# Patient Record
Sex: Male | Born: 1950 | Race: White | Hispanic: No | Marital: Married | Smoking: Former smoker
Health system: Southern US, Community
[De-identification: ages and names within clinical notes are randomized; demographics above are authoritative.]

## PROBLEM LIST (undated history)

## (undated) DIAGNOSIS — E119 Type 2 diabetes mellitus without complications: Secondary | ICD-10-CM

## (undated) DIAGNOSIS — H8109 Meniere's disease, unspecified ear: Secondary | ICD-10-CM

## (undated) HISTORY — PX: HERNIA REPAIR: SHX51

## (undated) HISTORY — PX: BACK SURGERY: SHX140

## (undated) HISTORY — PX: KNEE SURGERY: SHX244

---

## 2004-03-20 ENCOUNTER — Ambulatory Visit: Payer: Self-pay | Admitting: Family Medicine

## 2004-07-16 ENCOUNTER — Ambulatory Visit: Payer: Self-pay | Admitting: Family Medicine

## 2004-07-23 ENCOUNTER — Ambulatory Visit: Payer: Self-pay | Admitting: Family Medicine

## 2007-08-02 ENCOUNTER — Ambulatory Visit: Payer: Self-pay | Admitting: Urology

## 2009-08-19 ENCOUNTER — Ambulatory Visit: Payer: Self-pay | Admitting: Internal Medicine

## 2013-09-08 ENCOUNTER — Ambulatory Visit: Payer: Self-pay

## 2013-09-08 LAB — COMPREHENSIVE METABOLIC PANEL
ANION GAP: 11 (ref 7–16)
Albumin: 3.6 g/dL (ref 3.4–5.0)
Alkaline Phosphatase: 97 U/L
BUN: 11 mg/dL (ref 7–18)
Bilirubin,Total: 0.6 mg/dL (ref 0.2–1.0)
CO2: 29 mmol/L (ref 21–32)
CREATININE: 1.1 mg/dL (ref 0.60–1.30)
Calcium, Total: 8.9 mg/dL (ref 8.5–10.1)
Chloride: 93 mmol/L — ABNORMAL LOW (ref 98–107)
EGFR (African American): >60
EGFR (Non-African Amer.): >60
Glucose: 245 mg/dL — ABNORMAL HIGH (ref 65–99)
OSMOLALITY: 274 (ref 275–301)
Potassium: 3.2 mmol/L — ABNORMAL LOW (ref 3.5–5.1)
SGOT(AST): 30 U/L (ref 15–37)
SGPT (ALT): 38 U/L
Sodium: 133 mmol/L — ABNORMAL LOW (ref 136–145)
Total Protein: 7.9 g/dL (ref 6.4–8.2)

## 2013-09-08 LAB — CBC WITH DIFFERENTIAL/PLATELET
Basophil #: 0 10*3/uL (ref 0.0–0.1)
Basophil %: 0.2 %
EOS ABS: 0 10*3/uL (ref 0.0–0.7)
Eosinophil %: 0.1 %
HCT: 44.7 % (ref 40.0–52.0)
HGB: 15.2 g/dL (ref 13.0–18.0)
Lymphocyte #: 1.2 10*3/uL (ref 1.0–3.6)
Lymphocyte %: 11.6 %
MCH: 31.1 pg (ref 26.0–34.0)
MCHC: 34.1 g/dL (ref 32.0–36.0)
MCV: 91 fL (ref 80–100)
MONOS PCT: 11.6 %
Monocyte #: 1.2 x10 3/mm — ABNORMAL HIGH (ref 0.2–1.0)
Neutrophil #: 8.1 10*3/uL — ABNORMAL HIGH (ref 1.4–6.5)
Neutrophil %: 76.5 %
PLATELETS: 160 10*3/uL (ref 150–440)
RBC: 4.9 10*6/uL (ref 4.40–5.90)
RDW: 13.2 % (ref 11.5–14.5)
WBC: 10.6 10*3/uL (ref 3.8–10.6)

## 2013-09-08 LAB — AMYLASE: AMYLASE: 18 U/L — AB (ref 25–115)

## 2013-09-08 LAB — LIPASE, BLOOD: Lipase: 137 U/L (ref 73–393)

## 2016-05-07 IMAGING — CR DG ABDOMEN 3V
5 series · 5 of 5 positions shown · non-contrast
Comparison: None

CLINICAL DATA: Abdominal pain, vomiting, diarrhea

EXAM:
ABDOMEN SERIES

[chest pa]
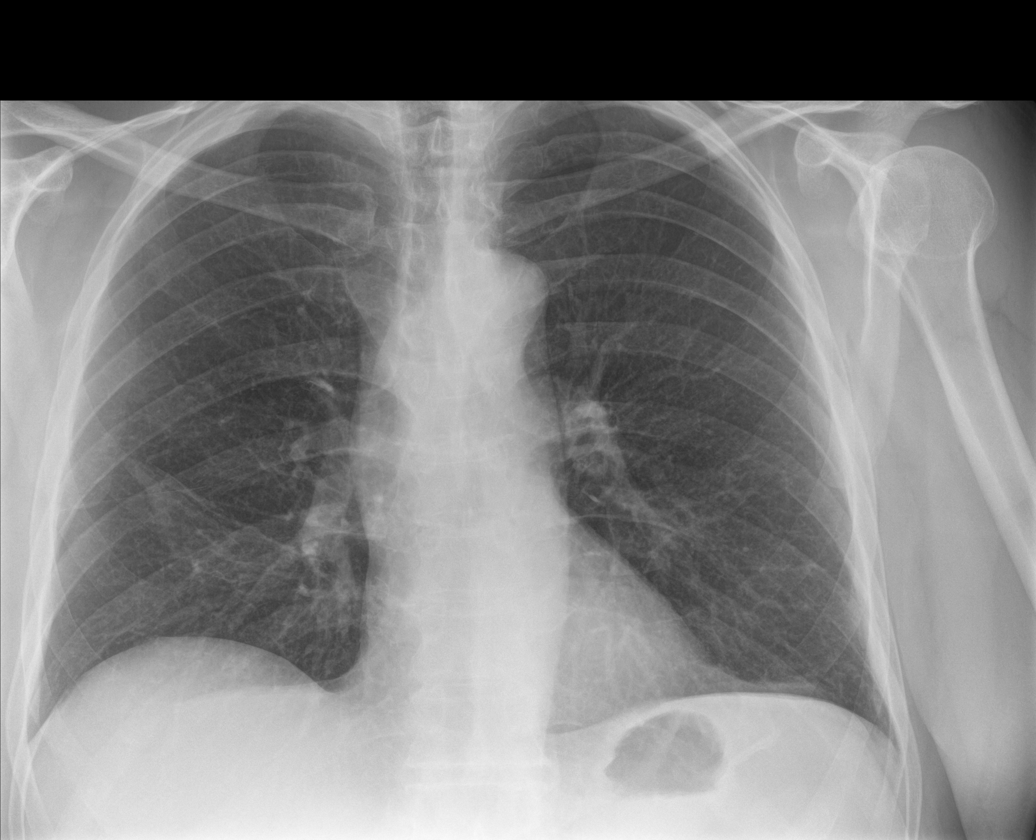

[abdomen erect (1 of 2)]
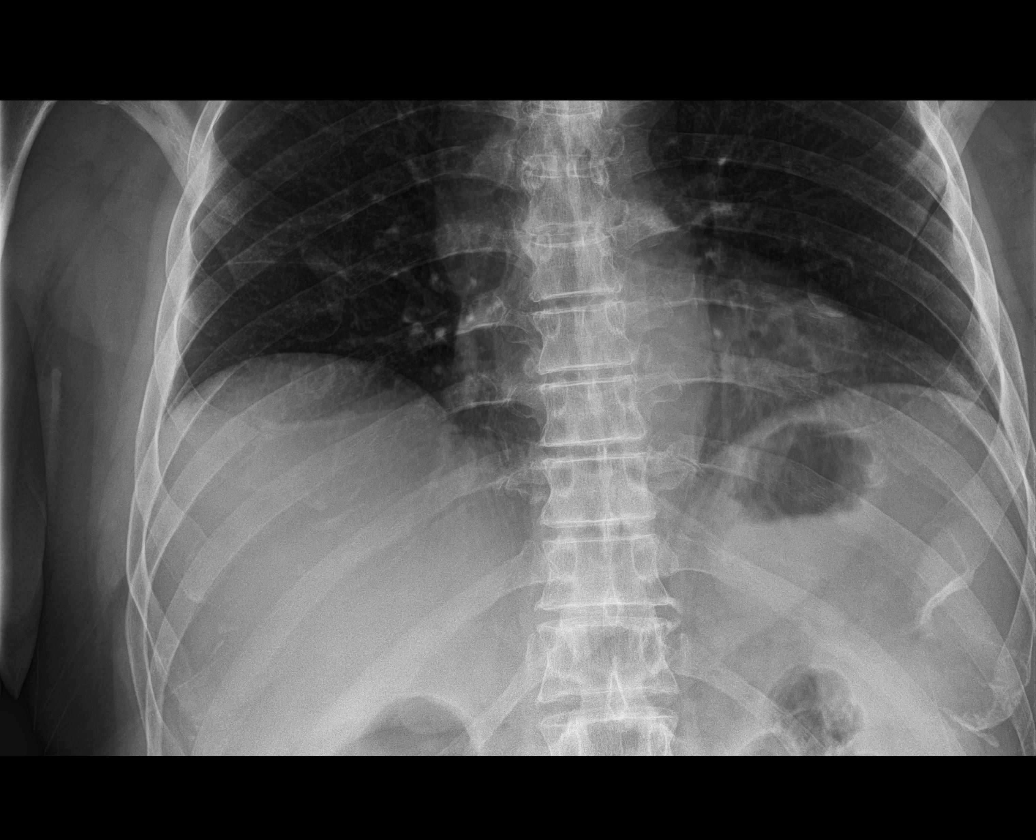

[abdomen erect (2 of 2)]
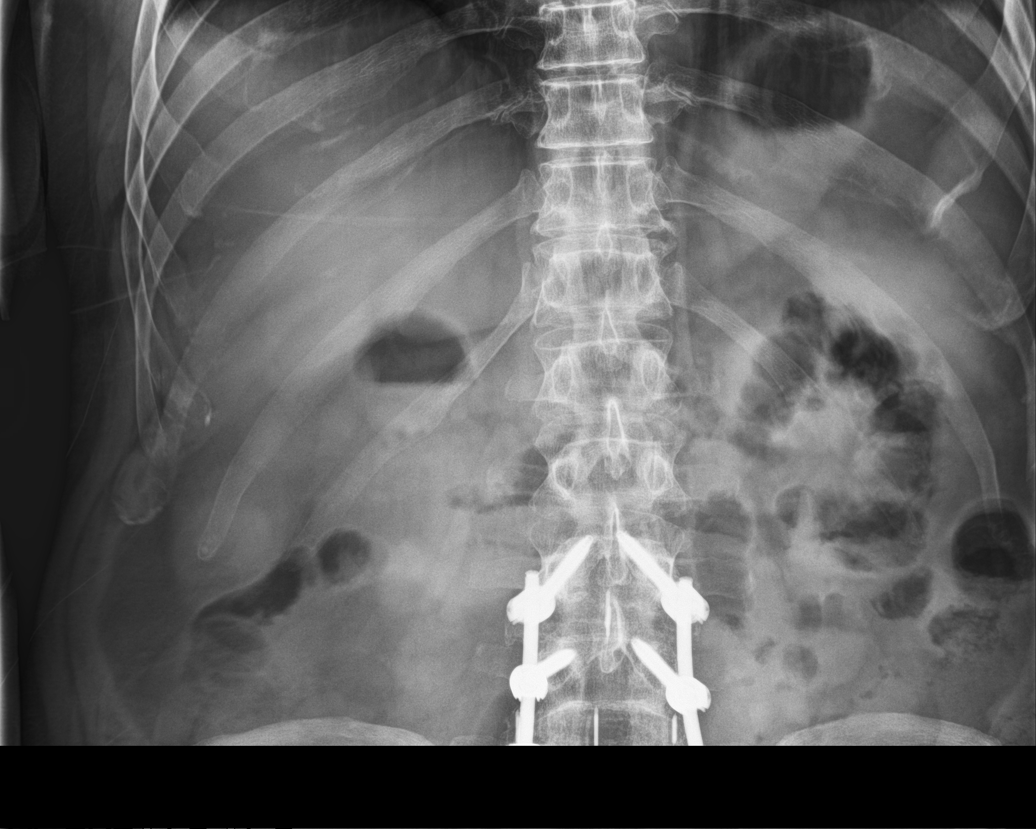

[abdomen supine (1 of 2)]
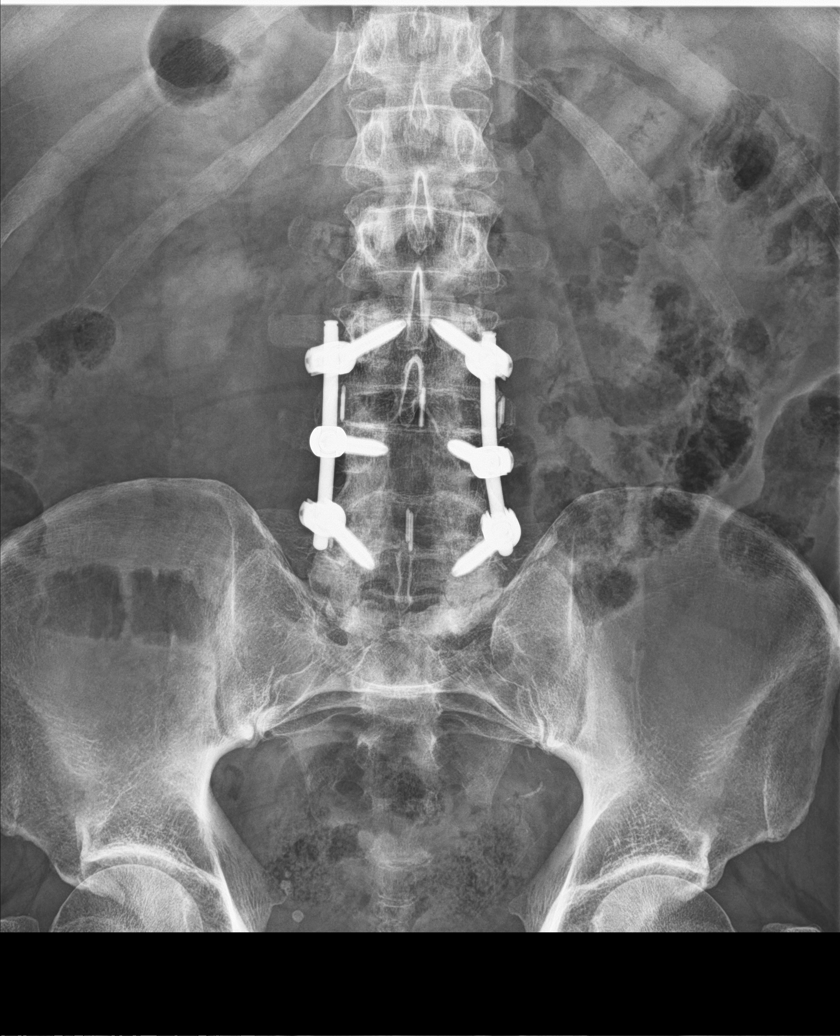

[abdomen supine (2 of 2)]
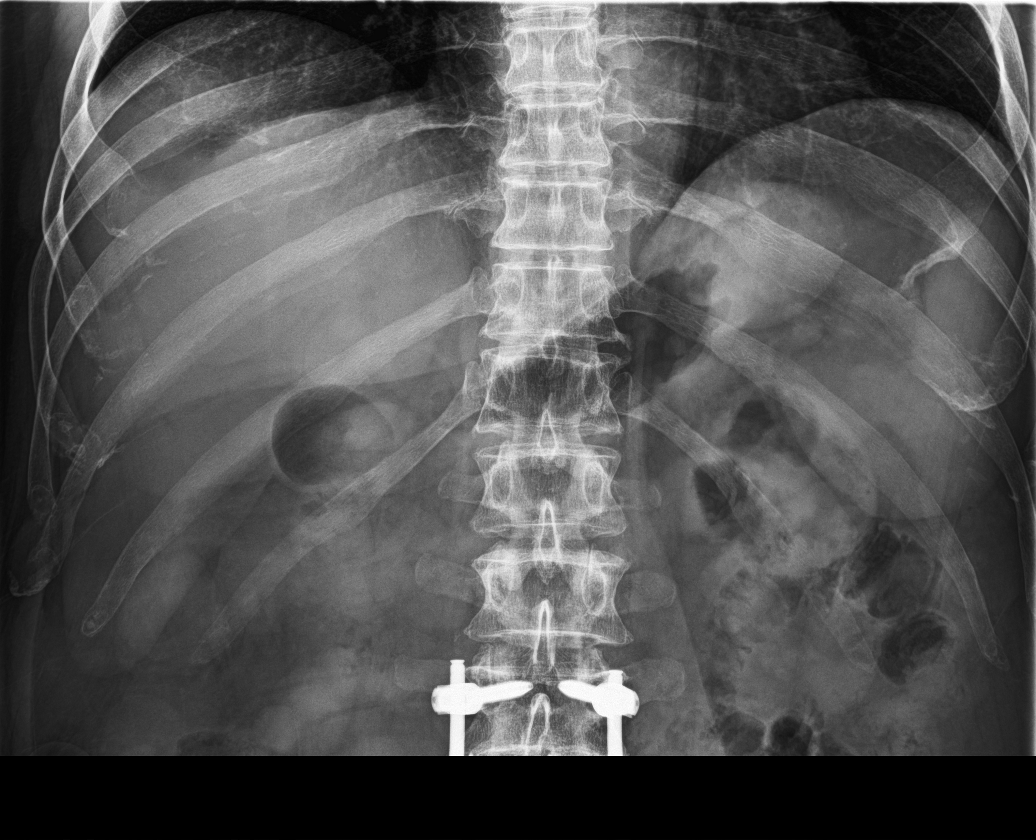

[5 of 5 positions shown; findings below may reference images not displayed]

FINDINGS: No active infiltrate or effusion is seen. Linear scarring or
atelectasis is noted at the right lung base. Mediastinal hilar
contours appear normal, and the heart is within normal limits in
size.

Supine and erect views of the abdomen show no bowel obstruction.
Only a small amount of small bowel gas is noted without distension.
No free air is seen on the erect view. Hardware is present for
fusion of L3-4 and L4-5. No opaque calculi are noted.
IMPRESSION: 1. Right basilar linear atelectasis or scarring.
2. No bowel obstruction.  No free air.

## 2019-02-25 ENCOUNTER — Other Ambulatory Visit: Payer: Self-pay

## 2019-02-25 ENCOUNTER — Ambulatory Visit: Payer: Medicare Other

## 2019-02-25 ENCOUNTER — Ambulatory Visit
Admission: EM | Admit: 2019-02-25 | Discharge: 2019-02-25 | Disposition: A | Discharge status: Home / Self Care | Payer: Medicare Other | Attending: Family Medicine | Admitting: Family Medicine

## 2019-02-25 DIAGNOSIS — J209 Acute bronchitis, unspecified: Secondary | ICD-10-CM | POA: Insufficient documentation

## 2019-02-25 DIAGNOSIS — R05 Cough: Secondary | ICD-10-CM | POA: Diagnosis not present

## 2019-02-25 DIAGNOSIS — R058 Other specified cough: Secondary | ICD-10-CM

## 2019-02-25 DIAGNOSIS — Z20822 Contact with and (suspected) exposure to covid-19: Secondary | ICD-10-CM | POA: Insufficient documentation

## 2019-02-25 DIAGNOSIS — R0981 Nasal congestion: Secondary | ICD-10-CM

## 2019-02-25 HISTORY — DX: Type 2 diabetes mellitus without complications: E11.9

## 2019-02-25 HISTORY — DX: Meniere's disease, unspecified ear: H81.09

## 2019-02-25 MED ORDER — ALBUTEROL SULFATE HFA 108 (90 BASE) MCG/ACT IN AERS: 2.0000 | INHALATION_SPRAY | Freq: Four times a day (QID) | RESPIRATORY_TRACT | 0 refills | Status: DC | PRN

## 2019-02-25 MED ORDER — BENZONATATE 100 MG PO CAPS: 100.0000 mg | ORAL_CAPSULE | Freq: Three times a day (TID) | ORAL | 0 refills | Status: DC | PRN

## 2019-02-25 MED ORDER — PREDNISONE 20 MG PO TABS: 40.0000 mg | ORAL_TABLET | Freq: Every day | ORAL | 0 refills | Status: DC

## 2019-02-25 MED ORDER — DOXYCYCLINE HYCLATE 100 MG PO CAPS: 100.0000 mg | ORAL_CAPSULE | Freq: Two times a day (BID) | ORAL | 0 refills | Status: DC

## 2019-02-25 NOTE — ED Triage Notes (Signed)
Pt presents with c/o sinus congestion for several months. He states he has been taking some OTC medications (Sudafed) but nothing else. Pt also c/o dry cough and some shob. He feels very fatigued. He was seen by the [facility] 02/17/19 and tested for COVID but they advised he was neg.

## 2019-02-25 NOTE — Discharge Instructions (Signed)
Take medication as prescribed. Rest. Drink plenty of fluids. Stop sudafed. Over the counter plain mucinex.   Follow up with your primary care physician this week as needed. Return to Urgent care for new or worsening concerns.

## 2019-02-25 NOTE — ED Provider Notes (Addendum)
[facility] ____________________________________________  Time seen: Approximately 1:17 PM  I have reviewed the triage vital signs and the nursing notes.   HISTORY  Chief Complaint Cough and sinus congestion   HPI Carlos Salazar. is a 69 y.o. male presenting for evaluation of cough and congestion complaints present for the last 2 months.  Patient reports over the last 2 to 3 weeks he has had more congestion and has had sinus pressure that has been continuous.  Patient reports that he often has a lot of postnasal drainage.  States his congestion with nasal and chest are worse first thing in the morning and improves during the day.  States he does sometimes have shortness of breath, particularly with exertion that he describes as tightness and intermittent wheezing.  States his wife has COPD and she tells him that she can often hear him wheezing when he is coughing.  States cough is occasionally productive.  States lot of productive nasal congestion occasionally blood with blowing his nose.  Denies hemoptysis.  States temperature has not exceeded 100 degrees during this.  States he has had continued nasal drip.  States he has been taking over-the-counter Sudafed for the last week which does help his congestion but symptoms has continued.  Denies known direct sick contacts.  States his wife has been doing well.  Denies chest pain or palpitations.  Denies extremity edema.  Denies changes in taste or smell, vomiting or diarrhea.  States decreased appetite, but overall continues eat and drink well.  States he often has sinus issues and some of this feels similar.  Denies other complaints.  Denies use antibiotic use.  Reports on 02/17/2019 he had a Covid test performed that was negative.  [facility] : PCP    Past Medical History:  Diagnosis Date  . Diabetes mellitus without complication (HCC)   . Meniere disease     There are no problems to display for this  patient.   Past Surgical History:  Procedure Laterality Date  . BACK SURGERY    . HERNIA REPAIR    . KNEE SURGERY       No current facility-administered medications for this encounter.  Current Outpatient Medications:  .  insulin aspart (NOVOLOG) 100 UNIT/ML injection, Inject into the skin 3 (three) times daily before meals., Disp: , Rfl:  .  insulin glargine (LANTUS) 100 UNIT/ML injection, Inject into the skin daily., Disp: , Rfl:  .  omeprazole (PRILOSEC) 40 MG capsule, Take 40 mg by mouth daily., Disp: , Rfl:  .  albuterol (VENTOLIN HFA) 108 (90 Base) MCG/ACT inhaler, Inhale 2 puffs into the lungs every 6 (six) hours as needed for wheezing., Disp: 1 g, Rfl: 0 .  benzonatate (TESSALON PERLES) 100 MG capsule, Take 1 capsule (100 mg total) by mouth 3 (three) times daily as needed for cough., Disp: 15 capsule, Rfl: 0 .  doxycycline (VIBRAMYCIN) 100 MG capsule, Take 1 capsule (100 mg total) by mouth 2 (two) times daily., Disp: 20 capsule, Rfl: 0 .  predniSONE (DELTASONE) 20 MG tablet, Take 2 tablets (40 mg total) by mouth daily., Disp: 10 tablet, Rfl: 0  Allergies Hydromorphone  Family History  Problem Relation Age of Onset  . Alzheimer's disease Father   . Stroke Father     Social History Social History   Tobacco Use  . Smoking status: Former Smoker    Quit date: 1998    Years since quitting: 23.0  . Smokeless tobacco: Never Used  Substance Use Topics  . Alcohol use: Not Currently  . Drug use: Never    Review of Systems Constitutional: No fever Eyes: No visual changes. ENT: No sore throat. Positive congestion.  Cardiovascular: Denies chest pain. Respiratory: Denies shortness of breath. Gastrointestinal: No abdominal pain.  No nausea, no vomiting.  No diarrhea. Genitourinary: Negative for dysuria. Musculoskeletal: Negative for extremity swelling. Skin: Negative for rash. Neurological: Negative for focal weakness or  numbness.    ____________________________________________   PHYSICAL EXAM:  VITAL SIGNS: ED Triage Vitals  Enc Vitals Group     BP 02/25/19 1151 116/85     Pulse Rate 02/25/19 1151 (!) 120 Recheck 104     Resp -- 18     Temp 02/25/19 1151 97.6 F (36.4 C)     Temp src --      SpO2 02/25/19 1151 100 %     Weight 02/25/19 1143 240 lb (108.9 kg)     Height 02/25/19 1143 6\' 1"  (1.854 m)     Head Circumference --      Peak Flow --      Pain Score 02/25/19 1143 0     Pain Loc --      Pain Edu? --      Excl. in GC? --     Constitutional: Alert and oriented. Well appearing and in no acute distress. Eyes: Conjunctivae are normal.  Head: Atraumatic.Mild to moderate tenderness to palpation bilateral maxillary sinuses. No frontal sinus tenderness to palpation. No swelling. No erythema.   Ears: no erythema, normal TMs bilaterally.   Nose: nasal congestion with bilateral nasal turbinate erythema and edema.   Mouth/Throat: Mucous membranes are moist. Oropharynx non-erythematous.No tonsillar swelling or exudate. Post pharyngeal drainage present.   Neck: No stridor.  No cervical spine tenderness to palpation. Hematological/Lymphatic/Immunilogical: No cervical lymphadenopathy. Cardiovascular: Normal rate, regular rhythm. Grossly normal heart sounds.  Good peripheral circulation. Respiratory: Normal respiratory effort.  No retractions. No wheezes, rales or rhonchi. Dry intermittent cough with bronchospasm.  Speaks in complete sentences. Musculoskeletal: Steady gait.  No lower extremity edema bilaterally. Neurologic:  Normal speech and language. No gait instability. Skin:  Skin is warm, dry and intact. No rash noted. Psychiatric: Mood and affect are normal. Speech and behavior are normal.  ___________________________________________   LABS (all labs ordered are listed, but only abnormal results are displayed)  Labs Reviewed  NOVEL CORONAVIRUS, NAA (HOSP ORDER, SEND-OUT TO REF LAB; TAT  18-24 HRS)    RADIOLOGY  DG Chest 2 View  Result Date: 02/25/2019 CLINICAL DATA:  Sinus congestion for several months, dry cough, shortness of breath, fatigue, COVID-19 test pending EXAM: CHEST - 2 VIEW COMPARISON:  None FINDINGS: Normal heart size, mediastinal contours, and pulmonary vascularity. Lungs clear. No pleural effusion or pneumothorax. Bones unremarkable. Faint LEFT nipple shadow, confirmed on lateral view. IMPRESSION: No acute abnormalities. Electronically Signed   By: 04/25/2019 M.D.   On: 02/25/2019 12:22   ____________________________________________   PROCEDURES Procedures     INITIAL IMPRESSION / ASSESSMENT AND PLAN / ED COURSE  Pertinent labs & imaging results that were available during my care of the patient were reviewed by me and considered in my medical decision making (see chart for details).  Overall well-appearing patient.  No acute distress.  COVID-19 testing repeated, advice given.  Chest x-ray as above per radiologist, reviewed, no acute abnormalities.  Suspect bronchitis and sinusitis, concern for bacterial.  Patient noted to have bronchospasm.  Will treat patient with oral doxycycline, prednisone,  as needed albuterol and as needed Tessalon Perles.  Discussed stop taking Sudafed and take over-the-counter plain Mucinex.  Monitor self closely.  Discussed strict follow-up and return parameters. Discussed indication, risks and benefits of medications with patient, including monitoring blood sugar.  Discussed follow up with Primary care physician this week. Discussed follow up and return parameters including no resolution or any worsening concerns. Patient verbalized understanding and agreed to plan.   ____________________________________________   FINAL CLINICAL IMPRESSION(S) / ED DIAGNOSES  Final diagnoses:  Cough present for greater than 3 weeks  Acute bronchitis, unspecified organism  Encounter for screening laboratory testing for COVID-19 virus      ED Discharge Orders         Ordered    predniSONE (DELTASONE) 20 MG tablet  Daily     02/25/19 1251    doxycycline (VIBRAMYCIN) 100 MG capsule  2 times daily     02/25/19 1251    albuterol (VENTOLIN HFA) 108 (90 Base) MCG/ACT inhaler  Every 6 hours PRN     02/25/19 1251    benzonatate (TESSALON PERLES) 100 MG capsule  3 times daily PRN     02/25/19 1251           Note: This dictation was prepared with Dragon dictation along with smaller phrase technology. Any transcriptional errors that result from this process are unintentional.         Marylene Land, NP 02/25/19 1334

## 2019-02-26 LAB — NOVEL CORONAVIRUS, NAA (HOSP ORDER, SEND-OUT TO REF LAB; TAT 18-24 HRS): SARS-CoV-2, NAA: NOT DETECTED

## 2019-03-05 ENCOUNTER — Other Ambulatory Visit: Payer: Self-pay

## 2019-03-05 ENCOUNTER — Ambulatory Visit
Admission: EM | Admit: 2019-03-05 | Discharge: 2019-03-05 | Disposition: A | Discharge status: Home / Self Care | Payer: Medicare Other | Attending: Emergency Medicine | Admitting: Emergency Medicine

## 2019-03-05 DIAGNOSIS — R1310 Dysphagia, unspecified: Secondary | ICD-10-CM | POA: Diagnosis not present

## 2019-03-05 DIAGNOSIS — G4762 Sleep related leg cramps: Secondary | ICD-10-CM

## 2019-03-05 DIAGNOSIS — J0100 Acute maxillary sinusitis, unspecified: Secondary | ICD-10-CM | POA: Diagnosis not present

## 2019-03-05 MED ORDER — FAMOTIDINE 20 MG PO TABS: 20.0000 mg | ORAL_TABLET | Freq: Two times a day (BID) | ORAL | 0 refills | Status: DC

## 2019-03-05 MED ORDER — COQ-10 150 MG PO CAPS: 150.0000 mg | ORAL_CAPSULE | Freq: Every day | ORAL | 0 refills | Status: AC

## 2019-03-05 MED ORDER — AMOXICILLIN-POT CLAVULANATE ER 1000-62.5 MG PO TB12: 2.0000 | ORAL_TABLET | Freq: Two times a day (BID) | ORAL | 0 refills | Status: DC

## 2019-03-05 NOTE — ED Provider Notes (Signed)
HPI  SUBJECTIVE:  Carlos TESAR Sr. is a 68 y.o. male who presents with persistent frontal headache, sore throat, dry cough, sinus pain and pressure, purulent nasal drainage, PND.  He reports wheezing, shortness of breath described as "tightness in my throat" and diffuse intermittent chest tightness/soreness.  States that he feels as if he can get a full breath of air in.  He reports dysphagia with solids only. Patient presented here 8 days ago on 1/8 for 2 months of cough and congestion getting worse over the past 2 to 3 days accompanied with sinus pressure postnasal drip.  Chest x-ray was normal.  Covid PCR negative.  Was treated for bronchitis and sinusitis with doxycycline, prednisone, albuterol, Tessalon.  States that he has been afebrile since starting the antibiotics, but he is not getting any better.  He has been doing salt water gargles, vinegar gargles, albuterol Neti pot, Doxy, prednisone, steamer, humidifier.  Symptoms relieved with clearing his throat with the albuterol inhaler.  Symptoms are worse with taking the doxycycline states that it makes his stomach hurt.  He also reports bilateral calf cramping only at night.  He has a past medical history of allergies, sinusitis, he is a former smoker.  Also history of diabetes, GERD which is well controlled with omeprazole 40 mg daily.  No history of pulmonary disease, hypertension.  PMD: At the [place].  Past Medical History:  Diagnosis Date  . Diabetes mellitus without complication (HCC)   . Meniere disease     Past Surgical History:  Procedure Laterality Date  . BACK SURGERY    . HERNIA REPAIR    . KNEE SURGERY      Family History  Problem Relation Age of Onset  . Alzheimer's disease Father   . Stroke Father     Social History   Tobacco Use  . Smoking status: Former Smoker    Quit date: 1998    Years since quitting: 23.0  . Smokeless tobacco: Never Used  Substance Use Topics  . Alcohol use: Not Currently  . Drug use: Never     No current facility-administered medications for this encounter.  Current Outpatient Medications:  .  albuterol (VENTOLIN HFA) 108 (90 Base) MCG/ACT inhaler, Inhale 2 puffs into the lungs every 6 (six) hours as needed for wheezing., Disp: 1 g, Rfl: 0 .  insulin aspart (NOVOLOG) 100 UNIT/ML injection, Inject into the skin 3 (three) times daily before meals., Disp: , Rfl:  .  insulin glargine (LANTUS) 100 UNIT/ML injection, Inject into the skin daily., Disp: , Rfl:  .  omeprazole (PRILOSEC) 40 MG capsule, Take 40 mg by mouth daily., Disp: , Rfl:  .  amoxicillin-clavulanate (AUGMENTIN XR) 1000-62.5 MG 12 hr tablet, Take 2 tablets by mouth 2 (two) times daily. X 7 days, Disp: 28 tablet, Rfl: 0 .  Coenzyme Q10 (COQ-10) 150 MG CAPS, Take 150 mg by mouth daily., Disp: 30 capsule, Rfl: 0 .  famotidine (PEPCID) 20 MG tablet, Take 1 tablet (20 mg total) by mouth 2 (two) times daily., Disp: 40 tablet, Rfl: 0  Allergies  Allergen Reactions  . Hydromorphone Nausea Only and Nausea And Vomiting     ROS  As noted in HPI.   Physical Exam  BP (!) 147/100 (BP Location: Left Arm)   Pulse (!) 103   Temp 98.8 F (37.1 C) (Oral)   Resp 18   Ht 6\' 1"  (1.854 m)   Wt 108.9 kg   SpO2 98%   BMI 31.66 kg/m  Constitutional: Well developed, well nourished, no acute distress Eyes:  EOMI, conjunctiva normal bilaterally HENT: Normocephalic, atraumatic,mucus membranes moist.  Purulent nasal congestion.  Erythematous, swollen turbinates.  Right more so than left maxillary sinus tenderness.  Tonsils absent.  Erythematous oropharynx with cobblestoning. Positive right-sided cervical lymphadenopathy.  No drooling, trismus, stridor. Respiratory: Normal inspiratory effort, lungs clear bilaterally Cardiovascular: Mild regular tachycardia no murmurs rubs or gallops GI: nondistended skin: No rash, skin intact Musculoskeletal: Calves symmetric, nontender, no edema. Neurologic: Alert & oriented x 3, no focal  neuro deficits Psychiatric: Speech and behavior appropriate   ED Course   Medications - No data to display  No orders of the defined types were placed in this encounter.   No results found for this or any previous visit (from the past 24 hour(s)). No results found.  ED Clinical Impression  1. Acute maxillary sinusitis, recurrence not specified   2. Dysphagia, unspecified type   3. Nocturnal leg cramps      ED Assessment/Plan  Previous records, labs, x-ray reviewed.  As noted in HPI.  Presentation consistent with sinusitis, however did not respond to doxycycline, will try  Augmentin 2 g twice a day for 7 days.  Restart Flonase.  Continue Nettie pot.  Will provide a spacer for him to use with his albuterol inhaler.  Doubt pneumonia based on clear lungs, negative recent x-ray, satting normally on room air.  He describes his shortness of breath as a tightness in his throat rather than located in his lungs.  He does not having drooling, stridor.  Will refer to ENT for the dysphagia.  wonder if some of his symptoms are coming from silent reflux. Dr. Kathyrn Sheriff on call. Will add Pepcid to the omeprazole 40 mg daily he takes already.  CoQ10 and magnesium for the calf cramps.  Discussed labs imaging, MDM, treatment plan, and plan for follow-up with patient.. patient agrees with plan.   Meds ordered this encounter  Medications  . famotidine (PEPCID) 20 MG tablet    Sig: Take 1 tablet (20 mg total) by mouth 2 (two) times daily.    Dispense:  40 tablet    Refill:  0  . amoxicillin-clavulanate (AUGMENTIN XR) 1000-62.5 MG 12 hr tablet    Sig: Take 2 tablets by mouth 2 (two) times daily. X 7 days    Dispense:  28 tablet    Refill:  0  . Coenzyme Q10 (COQ-10) 150 MG CAPS    Sig: Take 150 mg by mouth daily.    Dispense:  30 capsule    Refill:  0    *This clinic note was created using [employer]. Therefore, there may be occasional mistakes despite careful  proofreading.   ?    Melynda Ripple, MD 03/06/19 [phone]

## 2019-03-05 NOTE — Discharge Instructions (Addendum)
Discontinue the doxycycline.  try the Augmentin.  Take some probiotics to help prevent biotic associated diarrhea.  2 puffs from your albuterol inhaler every 4 hours using a spacer.  Continue Nettie pot, Flonase.  Add some Pepcid to the omeprazole.  Follow-up with ENT as soon as you possibly can.  Try some magnesium in addition to the co-Q10 for the leg cramps at night.

## 2019-03-05 NOTE — ED Triage Notes (Signed)
Patient states that he was seen here last week for bronchitis and given doxycycline and prednisone. Patient states that he has not noticed any improvement and feels like he is worsening. Patient reports that he has been having shortness of breath, sore throat and cough with stomach aches (that he thinks may be due to the doxy).

## 2021-08-06 ENCOUNTER — Ambulatory Visit
Admission: EM | Admit: 2021-08-06 | Discharge: 2021-08-06 | Disposition: A | Discharge status: Home / Self Care | Payer: Medicare (Managed Care) | Attending: Physician Assistant | Admitting: Physician Assistant

## 2021-08-06 DIAGNOSIS — E118 Type 2 diabetes mellitus with unspecified complications: Secondary | ICD-10-CM | POA: Diagnosis not present

## 2021-08-06 DIAGNOSIS — L03116 Cellulitis of left lower limb: Secondary | ICD-10-CM | POA: Diagnosis not present

## 2021-08-06 MED ORDER — DOXYCYCLINE HYCLATE 100 MG PO CAPS: 100.0000 mg | ORAL_CAPSULE | Freq: Two times a day (BID) | ORAL | 0 refills | Status: AC

## 2021-08-06 MED ORDER — CEFTRIAXONE SODIUM 500 MG IJ SOLR
1000.0000 mg | Freq: Once | INTRAMUSCULAR | Status: AC
  Administered 2021-08-06: 1000 mg via INTRAMUSCULAR

## 2021-08-06 MED ORDER — CEPHALEXIN 500 MG PO CAPS
500.0000 mg | ORAL_CAPSULE | Freq: Four times a day (QID) | ORAL | 0 refills | Status: AC
Start: 2021-08-06 — End: 2021-08-16

## 2021-08-06 NOTE — ED Triage Notes (Signed)
Onset 10 days ago, with left great toe pain. Pt reports no recent trauma to his toe. The last couple of days, pt noticed some color change.

## 2021-08-06 NOTE — ED Provider Notes (Signed)
[facility]    CSN: [id number] Arrival date & time: 08/06/21  1007      History   Chief Complaint Chief Complaint  Patient presents with   Toe Pain    HPI Carlos FRONCZAK Sr. is a 71 y.o. male who is an insulin-dependent diabetic.  Patient reports today for 10-day history of left foot pain and swelling as well as redness.  Patient says over the past couple days he has noticed the redness and swelling spread from the great toe all the way across the top of the foot.  He reports some pain but also states that he has peripheral neuropathy and cannot feel much in his lower extremities.  He says he has tried Epsom salt soak but it seemed to make things worse and not better.  He has not had any fevers but says he has not felt well and felt a little flushed yesterday.  No history of gout or other arthritis of his foot to his knowledge.  He does have a history of MRSA.  No other complaints.  HPI  Past Medical History:  Diagnosis Date   Diabetes mellitus without complication (HCC)    Meniere disease     There are no problems to display for this patient.   Past Surgical History:  Procedure Laterality Date   BACK SURGERY     HERNIA REPAIR     KNEE SURGERY         Home Medications    Prior to Admission medications   Medication Sig Start Date End Date Taking? Authorizing Provider  cephALEXin (KEFLEX) 500 MG capsule Take 1 capsule (500 mg total) by mouth 4 (four) times daily for 10 days. 08/06/21 08/16/21 Yes Shirlee Latch, PA-C  doxycycline (VIBRAMYCIN) 100 MG capsule Take 1 capsule (100 mg total) by mouth 2 (two) times daily for 10 days. 08/06/21 08/16/21 Yes Shirlee Latch, PA-C  albuterol (VENTOLIN HFA) 108 (90 Base) MCG/ACT inhaler Inhale 2 puffs into the lungs every 6 (six) hours as needed for wheezing. 02/25/19   Renford Dills, NP  famotidine (PEPCID) 20 MG tablet Take 1 tablet (20 mg total) by mouth 2 (two) times daily. 03/05/19   Domenick Gong, MD  insulin  aspart (NOVOLOG) 100 UNIT/ML injection Inject into the skin 3 (three) times daily before meals.    [provider]  insulin glargine (LANTUS) 100 UNIT/ML injection Inject into the skin daily.    [provider]  omeprazole (PRILOSEC) 40 MG capsule Take 40 mg by mouth daily.    [provider]    Family History Family History  Problem Relation Age of Onset   Alzheimer's disease Father    Stroke Father     Social History Social History   Tobacco Use   Smoking status: Former    Types: Cigarettes    Quit date: 1998    Years since quitting: 25.4   Smokeless tobacco: Never  Vaping Use   Vaping Use: Never used  Substance Use Topics   Alcohol use: Not Currently   Drug use: Never     Allergies   Hydromorphone   Review of Systems Review of Systems  Constitutional:  Negative for fatigue and fever.  Musculoskeletal:  Positive for arthralgias and joint swelling. Negative for gait problem.  Skin:  Positive for color change. Negative for wound.  Neurological:  Positive for numbness (peripheral neuropathy). Negative for weakness.     Physical Exam Triage Vital Signs ED Triage Vitals  Enc  Vitals Group     BP 08/06/21 1048 124/83     Pulse Rate 08/06/21 1048 (!) 103     Resp 08/06/21 1048 18     Temp 08/06/21 1049 98.5 F (36.9 C)     Temp Source 08/06/21 1049 Oral     SpO2 08/06/21 1048 99 %     Weight --      Height --      Head Circumference --      Peak Flow --      Pain Score 08/06/21 1051 7     Pain Loc --      Pain Edu? --      Excl. in GC? --    No data found.  Updated Vital Signs BP 124/83 (BP Location: Right Arm)   Pulse (!) 103   Temp 98.5 F (36.9 C) (Oral)   Resp 18   SpO2 99%      Physical Exam Vitals and nursing note reviewed.  Constitutional:      General: He is not in acute distress.    Appearance: Normal appearance. He is well-developed. He is not ill-appearing.  HENT:     Head: Normocephalic and atraumatic.   Eyes:     General: No scleral icterus.    Conjunctiva/sclera: Conjunctivae normal.  Cardiovascular:     Rate and Rhythm: Regular rhythm. Tachycardia present.     Pulses: Normal pulses.     Heart sounds: Normal heart sounds.  Pulmonary:     Effort: Pulmonary effort is normal. No respiratory distress.     Breath sounds: Normal breath sounds.  Musculoskeletal:     Cervical back: Neck supple.  Skin:    General: Skin is warm and dry.     Capillary Refill: Capillary refill takes less than 2 seconds.     Comments: Left foot: See images below.  Of the plantar aspect of the great toe there is a puncture/laceration that is healing.  Slight surrounding erythema but majority of erythema, swelling is of the dorsal great toe and forefoot.  He has a little bit of tenderness of the forefoot but no tenderness of the great toe.  There is also a small contusion of the great toe and a couple abrasions of his ankle.  Pitting edema to ankle.  Full range of motion of foot.  Mildly antalgic gait.  Neurological:     General: No focal deficit present.     Mental Status: He is alert. Mental status is at baseline.     Motor: No weakness.     Gait: Gait abnormal.  Psychiatric:        Mood and Affect: Mood normal.        Behavior: Behavior normal.         UC Treatments / Results  Labs (all labs ordered are listed, but only abnormal results are displayed) Labs Reviewed - No data to display  EKG   Radiology No results found.  Procedures Procedures (including critical care time)  Medications Ordered in UC Medications  cefTRIAXone (ROCEPHIN) injection 1,000 mg (1,000 mg Intramuscular Given 08/06/21 1119)    Initial Impression / Assessment and Plan / UC Course  I have reviewed the triage vital signs and the nursing notes.  Pertinent labs & imaging results that were available during my care of the patient were reviewed by me and considered in my medical decision making (see chart for  details).  71 year old male presenting for right foot pain, swelling and redness for the  past 10 days which has recently worsened the past couple of days.  Also reports feeling flushed and a little unwell.  Images obtained and added to chart show that he has significant erythema and swelling of the forefoot and great toe of the left foot.  He does have a couple abrasions of his ankle small contusion of his great toe and a small laceration/puncture of his plantar great toe.  Presentation, especially in the face of insulin-dependent diabetic, consistent with diabetic foot infection.  Patient given 1 g Rocephin in clinic today IM and sent prescriptions for Keflex and doxycycline to pharmacy as well as placed referral to wound care center since he says he will not have good follow-up with his primary care provider.  He states he is try to look for a new primary care provider.  I drew a line around the area of redness and advised him if the redness spreads or he has increased pain, fever or is feeling worse he needs to go to the emergency department as he may need IV antibiotics.  Expressed the importance of closely monitoring condition and having a low threshold to go to ER if anything is looking or feeling worse.   Final Clinical Impressions(s) / UC Diagnoses   Final diagnoses:  Cellulitis of left foot  Diabetic foot (HCC)     Discharge Instructions      -You have been given a injection of an antibiotic in the clinic today for infection of your foot.  I have also sent 2 different oral antibiotics to pharmacy for you.  Take the dose of the Keflex before bed this evening but start the doxycycline as soon as you get it and take 2 doses today. - Keep your foot clean and dry.  Elevate to help with swelling. - I have placed a referral to the wound care center so they can evaluate you.  This will need to be monitored closely.  You should go to the emergency department if you have any signs red of the redness  or increased swelling, pain or develop fever.  Otherwise, please follow-up with the wound care center and inform your primary care provider. - You can have Tylenol for pain relief but your pain should be improving in the next couple of days.     ED Prescriptions     Medication Sig Dispense Auth. Provider   cephALEXin (KEFLEX) 500 MG capsule Take 1 capsule (500 mg total) by mouth 4 (four) times daily for 10 days. 40 capsule Eusebio Friendly B, PA-C   doxycycline (VIBRAMYCIN) 100 MG capsule Take 1 capsule (100 mg total) by mouth 2 (two) times daily for 10 days. 20 capsule Shirlee Latch, PA-C      I have reviewed the PDMP during this encounter.   Shirlee Latch, PA-C 08/06/21 1135

## 2021-08-06 NOTE — Discharge Instructions (Addendum)
-  You have been given a injection of an antibiotic in the clinic today for infection of your foot.  I have also sent 2 different oral antibiotics to pharmacy for you.  Take the dose of the Keflex before bed this evening but start the doxycycline as soon as you get it and take 2 doses today. - Keep your foot clean and dry.  Elevate to help with swelling. - I have placed a referral to the wound care center so they can evaluate you.  This will need to be monitored closely.  You should go to the emergency department if you have any signs red of the redness or increased swelling, pain or develop fever.  Otherwise, please follow-up with the wound care center and inform your primary care provider. - You can have Tylenol for pain relief but your pain should be improving in the next couple of days.

## 2021-10-24 IMAGING — CR DG CHEST 2V
2 series · 3 of 3 positions shown · non-contrast
Comparison: None

CLINICAL DATA: Sinus congestion for several months, dry cough,
shortness of breath, fatigue, [id number] test pending

EXAM:
CHEST - 2 VIEW

[chest pa]
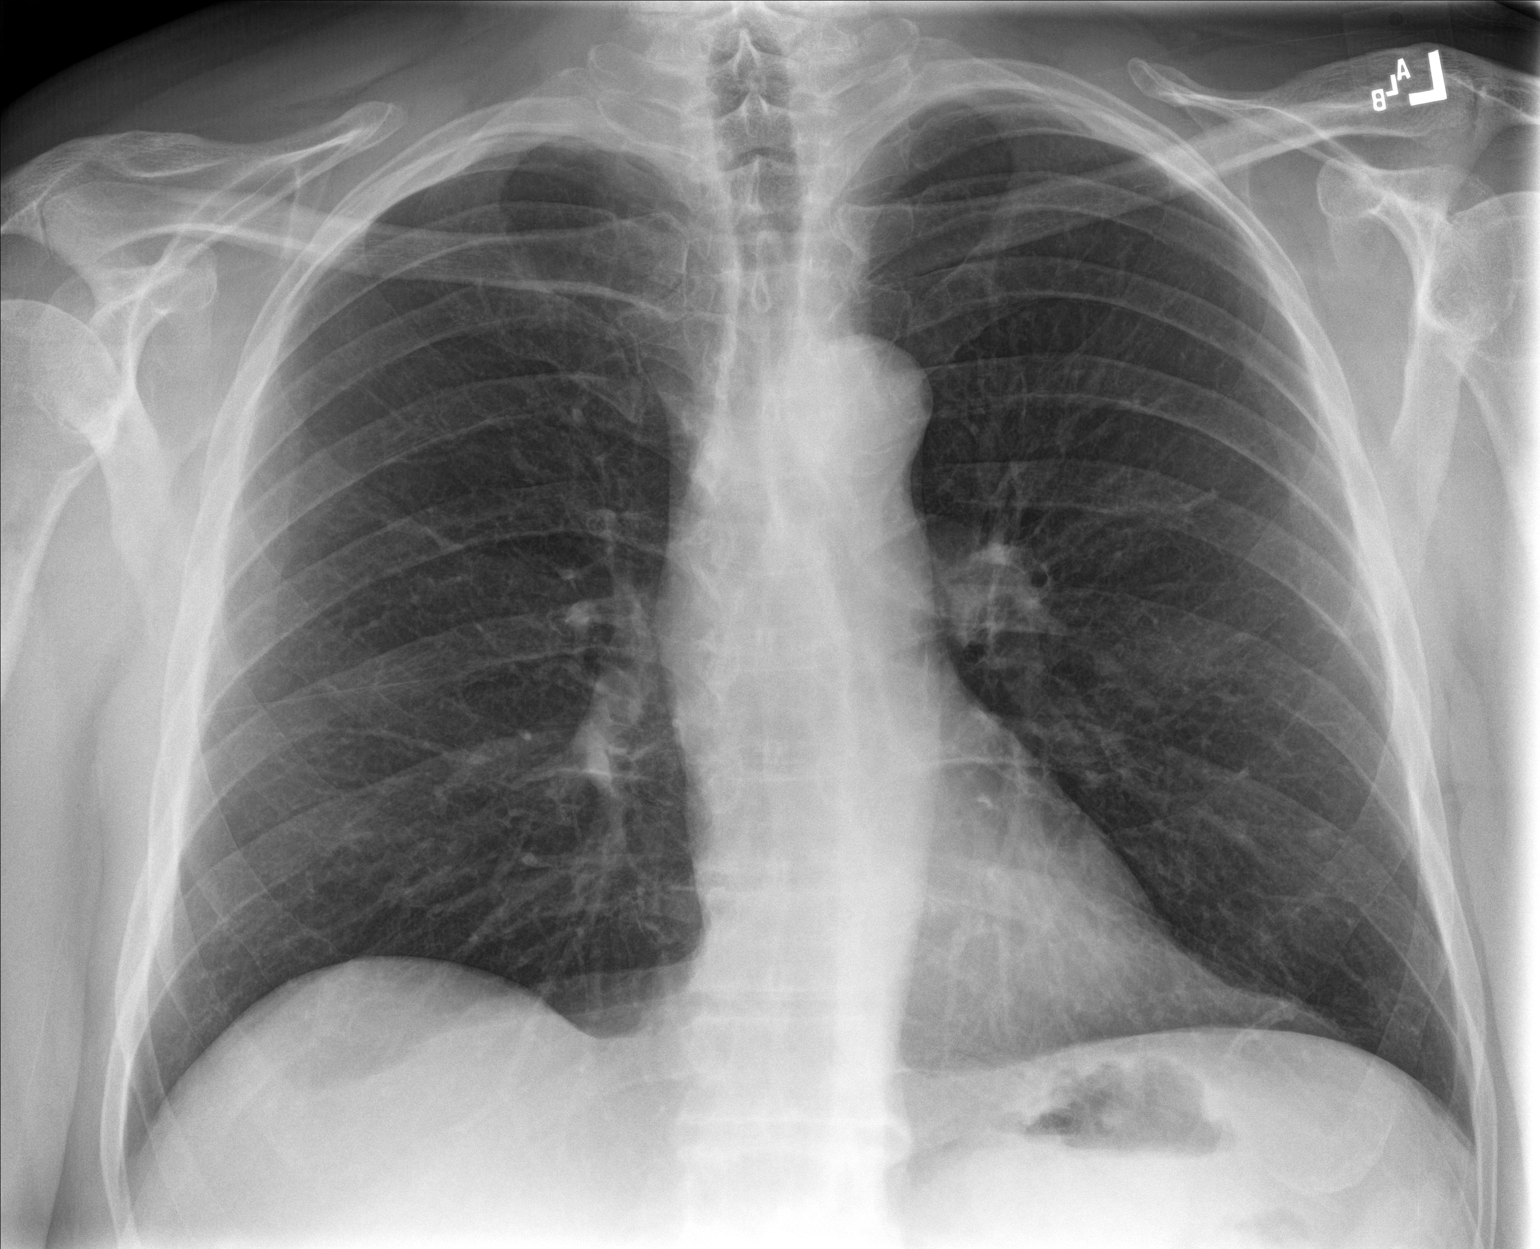

[Series 2: chest lat · 0.14mm/px · 2 of 2 slices shown]
[im 1/2]
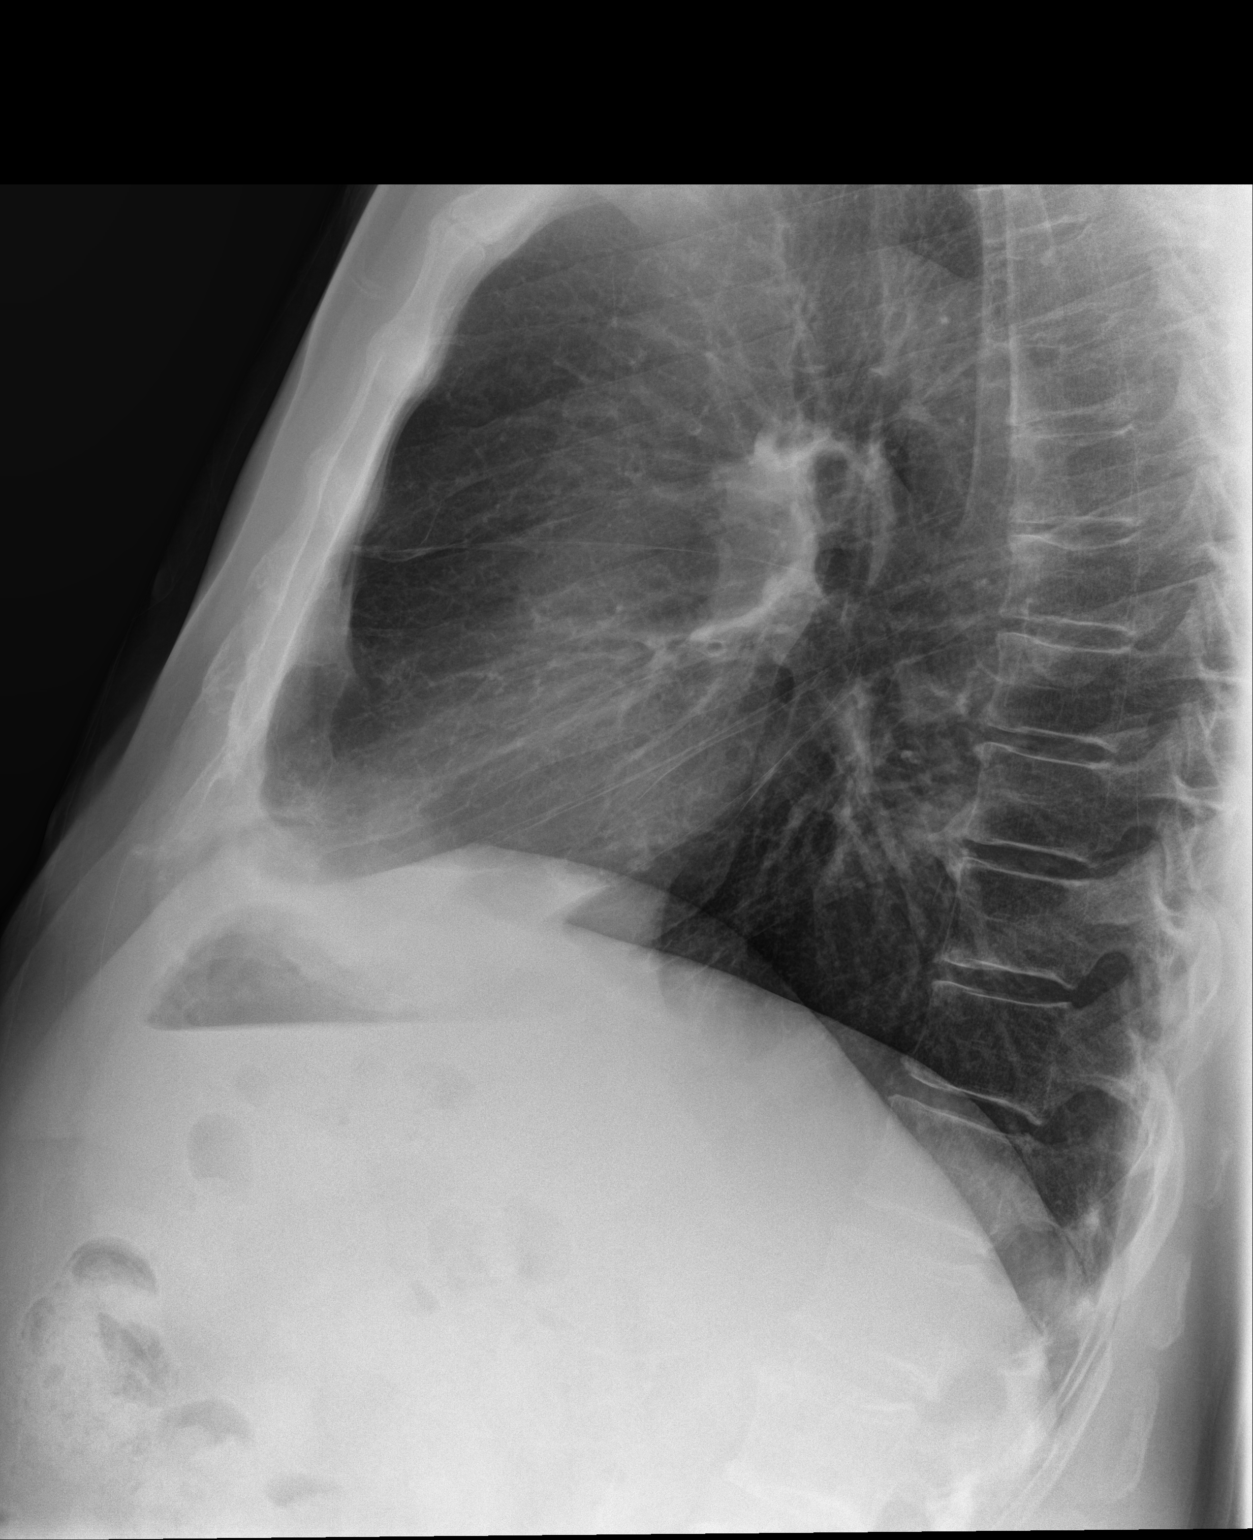
[im 2/2]
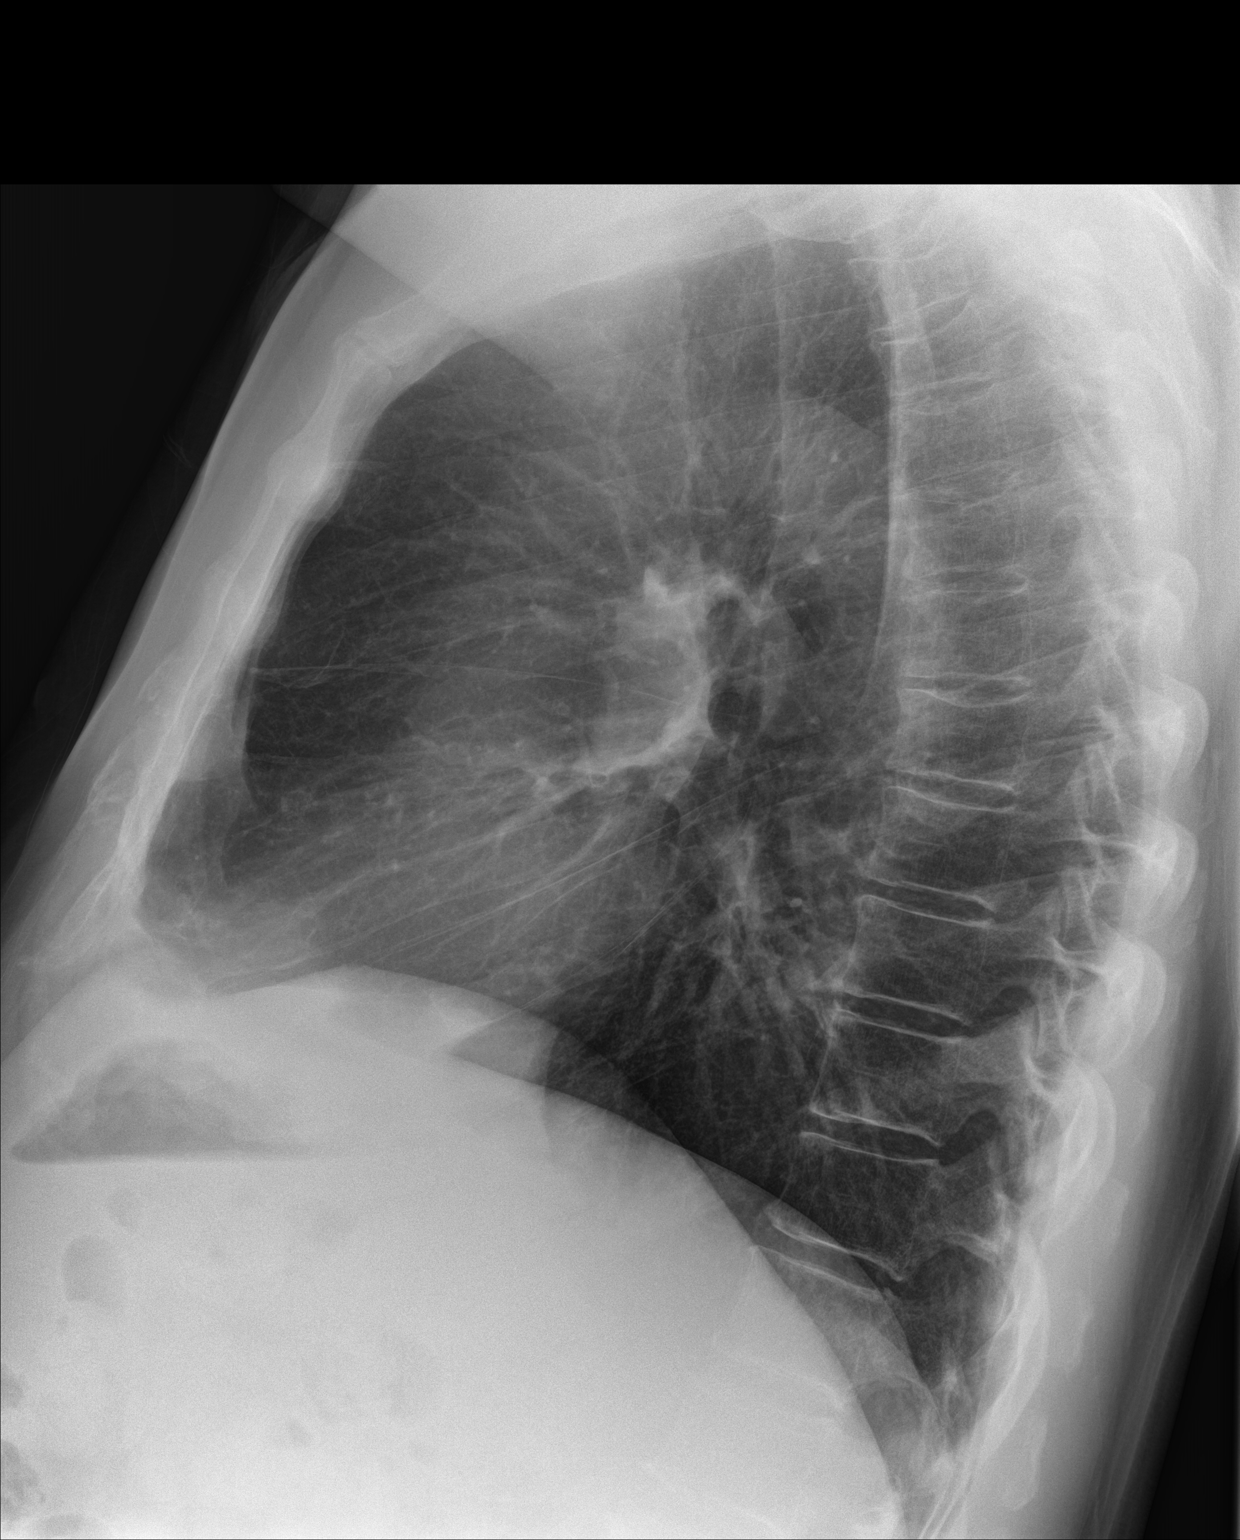

[3 of 3 positions shown; findings below may reference images not displayed]

FINDINGS: Normal heart size, mediastinal contours, and pulmonary vascularity.

Lungs clear.

No pleural effusion or pneumothorax.

Bones unremarkable.

Faint LEFT nipple shadow, confirmed on lateral view.
IMPRESSION: No acute abnormalities.

## 2022-06-25 ENCOUNTER — Ambulatory Visit (INDEPENDENT_AMBULATORY_CARE_PROVIDER_SITE_OTHER): Payer: Medicare (Managed Care) | Admitting: Urology

## 2022-06-25 ENCOUNTER — Encounter: Payer: Self-pay | Admitting: Urology

## 2022-06-25 VITALS — BP 120/72 | HR 106 | Ht 72.0 in | Wt 230.0 lb

## 2022-06-25 DIAGNOSIS — R399 Unspecified symptoms and signs involving the genitourinary system: Secondary | ICD-10-CM | POA: Diagnosis not present

## 2022-06-25 DIAGNOSIS — R972 Elevated prostate specific antigen [PSA]: Secondary | ICD-10-CM | POA: Diagnosis not present

## 2022-06-25 LAB — URINALYSIS, COMPLETE
Bilirubin, UA: NEGATIVE
Ketones, UA: NEGATIVE
Leukocytes,UA: NEGATIVE
Nitrite, UA: NEGATIVE
Protein,UA: NEGATIVE
RBC, UA: NEGATIVE
Specific Gravity, UA: 1.01 (ref 1.005–1.030)
Urobilinogen, Ur: 0.2 mg/dL (ref 0.2–1.0)
pH, UA: 5.5 (ref 5.0–7.5)

## 2022-06-25 LAB — BLADDER SCAN AMB NON-IMAGING: Scan Result: 146

## 2022-06-25 LAB — MICROSCOPIC EXAMINATION
Bacteria, UA: NONE SEEN
RBC, Urine: NONE SEEN /hpf (ref 0–2)

## 2022-06-25 MED ORDER — SILODOSIN 8 MG PO CAPS: 8.0000 mg | ORAL_CAPSULE | Freq: Every day | ORAL | 1 refills | Status: DC

## 2022-06-25 NOTE — Progress Notes (Signed)
I, Duke Salvia, acting as a Neurosurgeon for Riki Altes, MD., have documented all relevant documentation on the behalf of Riki Altes, MD, as directed by  Riki Altes, MD while in the presence of Riki Altes, MD.   06/25/2022 11:48 AM   Carlos Callander Sr. 02/13/1951 [id number]  Referring provider: Valere Dross, MD [address] [place],  [place] [address]  Chief Complaint  Patient presents with   Elevated PSA    HPI: Carlos LAWVER Sr. is a 72 y.o. male referred for evaluation of elevated PSA.  Patient unaware of an abnormal PSA, and he and his wife thought he was being referred for LUTS. There were no forwarding records or PSA results. 3 month history of bothersome LUTS, including urinary hesitancy, weak stream, frequency, urgency, and urge incontinence. No previous treatments. Did have " light stroke" January 2024 and symptoms started shortly after this happened. No dysuria or gross hematuria. No flank, abdominal, or pelvic pain. IPSS today 18.   PMH: Past Medical History:  Diagnosis Date   Diabetes mellitus without complication (HCC)    Meniere disease     Surgical History: Past Surgical History:  Procedure Laterality Date   BACK SURGERY     HERNIA REPAIR     KNEE SURGERY      Home Medications:  Allergies as of 06/25/2022       Reactions   Hydromorphone Nausea Only, Nausea And Vomiting   Metformin Diarrhea        Medication List        Accurate as of Jun 25, 2022 11:48 AM. If you have any questions, ask your nurse or doctor.          albuterol 108 (90 Base) MCG/ACT inhaler Commonly known as: VENTOLIN HFA Inhale 2 puffs into the lungs every 6 (six) hours as needed for wheezing.   famotidine 20 MG tablet Commonly known as: PEPCID Take 1 tablet (20 mg total) by mouth 2 (two) times daily.   insulin aspart 100 UNIT/ML injection Commonly known as: novoLOG Inject into the skin 3 (three) times daily before meals.   insulin glargine  100 UNIT/ML injection Commonly known as: LANTUS Inject into the skin daily.   omeprazole 40 MG capsule Commonly known as: PRILOSEC Take 40 mg by mouth daily.   silodosin 8 MG Caps capsule Commonly known as: RAPAFLO Take 1 capsule (8 mg total) by mouth daily with breakfast. Started by: Riki Altes, MD        Allergies:  Allergies  Allergen Reactions   Hydromorphone Nausea Only and Nausea And Vomiting   Metformin Diarrhea    Family History: Family History  Problem Relation Age of Onset   Alzheimer's disease Father    Stroke Father     Social History:  reports that he quit smoking about 26 years ago. His smoking use included cigarettes. He has never used smokeless tobacco. He reports that he does not currently use alcohol. He reports that he does not use drugs.   Physical Exam: BP 120/72   Pulse (!) 106   Ht 6' (1.829 m)   Wt 230 lb (104.3 kg)   BMI 31.19 kg/m   Constitutional:  Alert and oriented, No acute distress. HEENT:  AT, moist mucus membranes.  Trachea midline, no masses. Cardiovascular: No clubbing, cyanosis, or edema. Respiratory: Normal respiratory effort, no increased work of breathing. GI: Abdomen is soft, nontender, nondistended, no abdominal masses GU: Prostate 45 g, smooth  without nodules, consistency is normal. Skin: No rashes, bruises or suspicious lesions. Neurologic: Grossly intact, no focal deficits, moving all 4 extremities. Psychiatric: Normal mood and affect.   Assessment & Plan:    1. Lower urinary tract symptoms We discussed the most common cause of lower urinary tract symptoms are prostate enlargement. Detrusory overactivity, secondary to CVA was also discussed. PVR today 146. RX Silidosin 8 mg daily. One month follow up for symptom re-check and repeat PVR.  2. Elevated PSA No forwarding records were sent and any recent PSA results will be requested.      [facility] [address] [place], [place] [address] [phone]

## 2022-06-26 ENCOUNTER — Encounter: Payer: Self-pay | Admitting: Urology

## 2022-07-18 ENCOUNTER — Other Ambulatory Visit: Payer: Self-pay | Admitting: Urology

## 2022-07-25 ENCOUNTER — Ambulatory Visit: Payer: Medicare (Managed Care) | Admitting: Urology

## 2023-04-24 ENCOUNTER — Other Ambulatory Visit: Payer: Self-pay

## 2023-04-24 DIAGNOSIS — R399 Unspecified symptoms and signs involving the genitourinary system: Secondary | ICD-10-CM

## 2023-04-24 DIAGNOSIS — N401 Enlarged prostate with lower urinary tract symptoms: Secondary | ICD-10-CM

## 2023-04-28 ENCOUNTER — Ambulatory Visit: Payer: Medicare (Managed Care) | Admitting: Urology

## 2023-06-24 ENCOUNTER — Encounter: Payer: Self-pay | Admitting: Urology

## 2023-06-24 ENCOUNTER — Ambulatory Visit: Payer: Medicare (Managed Care) | Admitting: Urology

## 2023-06-24 VITALS — BP 151/96 | HR 105 | Ht 73.0 in | Wt 233.0 lb

## 2023-06-24 DIAGNOSIS — R399 Unspecified symptoms and signs involving the genitourinary system: Secondary | ICD-10-CM

## 2023-06-24 DIAGNOSIS — N401 Enlarged prostate with lower urinary tract symptoms: Secondary | ICD-10-CM

## 2023-06-24 DIAGNOSIS — R972 Elevated prostate specific antigen [PSA]: Secondary | ICD-10-CM | POA: Diagnosis not present

## 2023-06-24 LAB — MICROSCOPIC EXAMINATION: Bacteria, UA: NONE SEEN

## 2023-06-24 LAB — URINALYSIS, COMPLETE
Bilirubin, UA: NEGATIVE
Ketones, UA: NEGATIVE
Leukocytes,UA: NEGATIVE
Nitrite, UA: NEGATIVE
Protein,UA: NEGATIVE
RBC, UA: NEGATIVE
Specific Gravity, UA: 1.01 (ref 1.005–1.030)
Urobilinogen, Ur: 0.2 mg/dL (ref 0.2–1.0)
pH, UA: 6 (ref 5.0–7.5)

## 2023-06-24 LAB — BLADDER SCAN AMB NON-IMAGING

## 2023-06-24 NOTE — Progress Notes (Signed)
 I, Carlos Salazar, acting as a scribe for Carlos Knapp, MD., have documented all relevant documentation on the behalf of Carlos Knapp, MD, as directed by Carlos Knapp, MD while in the presence of Carlos Knapp, MD.  06/24/2023 3:43 PM   Carlos Friedlander Sr. [birth year] [id number]  Referring provider: Suzen Escort, MD [address] [place],  [place] [address]  Chief Complaint  Patient presents with   Benign Prostatic Hypertrophy    HPI: Carlos GORRIE Sr. is a 73 y.o. male presents for a follow-up visit.  Initially seen 06/25/2022 for lower urinary tract symptoms and an elevated PSA of 5.5 PVR that visit was 146, and he was started on silodosin  8 mg daily. 1 month follow-up was recommended, though he has not been seen since that time.  No prior PSA results were available by his PCP.  Today family members report his voiding symptoms have worsened, and he has urinary frequency-urgency with urge incontinence. He is wearing diapers. No longer taking silodosin .  Denies dysuria, gross hematuria Denies flank, abdominal, or pelvic pain.  His PSA has not been repeated.    PMH: Past Medical History:  Diagnosis Date   Diabetes mellitus without complication (HCC)    Meniere disease     Surgical History: Past Surgical History:  Procedure Laterality Date   BACK SURGERY     HERNIA REPAIR     KNEE SURGERY      Home Medications:  Allergies as of 06/24/2023       Reactions   Hydromorphone Nausea Only, Nausea And Vomiting   Metformin Diarrhea        Medication List        Accurate as of Jun 24, 2023  3:43 PM. If you have any questions, ask your nurse or doctor.          albuterol  108 (90 Base) MCG/ACT inhaler Commonly known as: VENTOLIN  HFA Inhale 2 puffs into the lungs every 6 (six) hours as needed for wheezing.   famotidine  20 MG tablet Commonly known as: PEPCID  Take 1 tablet (20 mg total) by mouth 2 (two) times daily.   insulin aspart 100 UNIT/ML  injection Commonly known as: novoLOG Inject into the skin 3 (three) times daily before meals.   insulin glargine 100 UNIT/ML injection Commonly known as: LANTUS Inject into the skin daily.   omeprazole 40 MG capsule Commonly known as: PRILOSEC Take 40 mg by mouth daily.   silodosin  8 MG Caps capsule Commonly known as: RAPAFLO  TAKE 1 CAPSULE BY MOUTH DAILY WITH BREAKFAST.        Allergies:  Allergies  Allergen Reactions   Hydromorphone Nausea Only and Nausea And Vomiting   Metformin Diarrhea    Family History: Family History  Problem Relation Age of Onset   Alzheimer's disease Father    Stroke Father     Social History:  reports that he quit smoking about 27 years ago. His smoking use included cigarettes. He has never used smokeless tobacco. He reports that he does not currently use alcohol. He reports that he does not use drugs.   Physical Exam: BP (!) 151/96   Pulse (!) 105   Ht 6\' 1"  (1.854 m)   Wt 233 lb (105.7 kg)   BMI 30.74 kg/m   Constitutional:  Alert and oriented, No acute distress. HEENT: [place] AT, moist mucus membranes.  Trachea midline, no masses. Cardiovascular: No clubbing, cyanosis, or edema. Respiratory: Normal respiratory effort, no increased  work of breathing. GI: Abdomen is soft, nontender, nondistended, no abdominal masses Skin: No rashes, bruises or suspicious lesions. Neurologic: Grossly intact, no focal deficits, moving all 4 extremities. Psychiatric: Normal mood and affect.  Urinalysis 3+ glucose dipstick, microscopy negative.    Assessment & Plan:    1. Lower urinary tract symptoms Primarily storage-related voiding symptoms with urge incontinence.  PVR today 80 mL Trial Gemtesa 75 mg daily PA follow-up 1 month for symptom recheck and repeat PVR.   2. Elevated PSA We discussed those prostate cancer guidelines do no longer recommend prostate cancer screening after age 22.  Since his PSA check last year was slightly elevated at 5.5  by strict criteria, we'll repeat today and if stable, would recommend no longer cheking based on current recommendations.  [facility] [address], [address] [place], [place] [address] [phone]

## 2023-06-25 LAB — PSA: Prostate Specific Ag, Serum: 8.2 ng/mL — ABNORMAL HIGH (ref 0.0–4.0)

## 2023-07-28 ENCOUNTER — Ambulatory Visit: Payer: Medicare (Managed Care) | Admitting: Urology

## 2023-07-29 NOTE — Discharge Summary (Signed)
 ------------------------------------------------------------------------------- Attestation signed by Orlando Morna Grater, MD at 07/29/23 1543 I saw and evaluated the patient, participating in the key portions of the service on the day of discharge.  I reviewed the resident's note and agree with the discharge plans and disposition. I personally spent <30 minutes in discharge planning services. Morna Grater Orlando, MD  -------------------------------------------------------------------------------   Physician Discharge Summary [facility] [address] [place] [address] Dept: [phone] Loc: [phone]   Identifying Information:  Carlos Salazar [birth year] [id number]  Primary Care Physician: Christianne Iven HERO, MD   Code Status: DNR and DNI  Admit Date: 07/18/2023  Discharge Date: 07/29/2023   Discharge To: Skilled nursing facility  Discharge Service: [facility] - General Medicine Floor Team MED L - Tower   Discharge Attending Physician: Morna Grater Orlando, MD  Discharge Diagnoses: Principal Problem (Resolved):   Toxic metabolic encephalopathy (POA: Unknown) Active Problems:   Chronic back pain (POA: Yes)   Unspecified depressive disorder (POA: Yes)   Benign prostatic hyperplasia (POA: Yes)   CAD (coronary artery disease) (POA: Yes)   Essential hypertension (POA: Yes)   Gastroesophageal reflux disease without esophagitis (POA: Yes)   History of fall (POA: Not Applicable)   History of medication noncompliance (POA: Not Applicable)   Hx of subarachnoid hemorrhage (POA: Not Applicable)   Meniere's disease (POA: Yes)   Mild cognitive impairment (POA: Yes)   Mixed hyperlipidemia (POA: Yes)   Sleep apnea (POA: Yes)   Type 2 diabetes mellitus without complications    (POA: Yes)   S/P cataract extraction and insertion of intraocular lens, left (POA: Not Applicable)   Osteoarthritis of both knees (POA: Yes)   Hx of transient ischemic attack (TIA)  (POA: Not Applicable) Resolved Problems:   UTI (urinary tract infection) (POA: Unknown)   Delirium (POA: Unknown)  Outpatient Provider Follow Up Issues:  [ ]  Referral placed to neurology per family preference for assessment of T12 compression fracture  Hospital Course:  73 yo M w/ PMHx HTN, GERD, SAH (residual L leg deficits), HLD, T2DM, OSA, depression, and Meniere's disease, Hx of multiple falls within last 6 months presented to [facility] for slurred speech, AMS s/p fall transferred to [facility] for encephalopathy needing MRI r/o stroke, ultimately treated for UTI and soft tissue infection with improvement in mental status.   Encephalopathy (resolved)  UTI  Delirium Presented to ED 2/2 slurred speech, RUE and RLE weakness, AMS after falling out of bed 07/18/23. Workup at [facility] ED was significant for acute vs acute on chronic T12 compression fracture, new mediastinal widening on CXR and leukocytosis with WBC count of 18.6. CT head, neck, maxillofacial, cervical spine, CTA head, R ankle and hip x-ray were reassuring. Patient has received Tylenol, IV fluids, vancomycin, cefepime in ED.  Pt HDS, afebrile. Physical exam at [facility] was concerning for slight R pronator drift, inability to hold R leg against gravity, difficulty following commands, A&Ox2, but 5/5 strength of left upper and left lower extremity, clear speech. Patient transferred to [facility] for MRI brain/stroke workup. MRI brain completed w/o evidence of stroke. Infectious work-up notable initially for UTI with small leukocyte esterase and WBC on UA. Urine culture with no growth. Was ultimately treated with a course of Keflex  to complete 7 days for potential UTI and likely soft tissue infection of his right ankle. Patient also likely has baseline dementia and some mild neurodeficits in the setting of his prior stroke. Mental status improved but continued to have episodes of likely  hospital induced delirium which were managed with  bowel regimen and delirium precautions. Discharged to SNF.   T2DM A1c on admission 6.5%. Initially treated with sliding scale insulin on admission given concerns for poor p.o. intake.  At home wife reports giving patient glargine 48 units nightly and NovoLog 6 units with meals. Insulin regimen was started during his admission and discharged to SNF with home regimen.   Wound R Malleolus with potential cellulitis Patient has evidence of chronic wound on R malleolus. Patient states has been there for 1-2 weeks, reports its 2/2 from his wheelchair. Does have erythema and mild tenderness to palpation on exam.  Overall less concerning for skin/soft tissue infection, no concern for septic joint.  No evidence of purulence.  Treated with a course of Keflex  with improvement in erythema.   Wound care as follows:  1. Cleanse wound with vashe, pat dry.  2. Apply non-alcohol skin barrier wipe to periwound and let dry 15 seconds.  3. Apply wound gel to gauze OR apply wound gel to wound bed.  4. Cover with gauze/appropriate cover dressing.  ** apply vashe moist gauze BID if gel unavailable**  5. Secure dressing appropriately.  6. Change daily/PRN if dislodged, soiled or saturated.   R Ear Pain c/f otitis externa Wife reports patient has intermittent right ear pain for the past 3+ weeks. Patient denies any changes in hearing or drainage from the ear.  On exam he was tender to palpation both in front of and behind the ear. Otoscope exam notable for some mild erythema in the right EAC. Right TM was translucent with no evidence of bulging or erythema concerning for middle ear infection. Unable to evaluate left EAC or TM given cerumen.  Overall most concerning for mild otitis externa. Treated with a 7-day course of acetic acid/hydrocortisone drops. Discussed referral to ENT in outpatient setting if ear pain persists.  BPH  Incontinence History of incontinence previously prescribed Flomax for BPH per wife not  currently taking. Recently seen by urology on 5/7 for incontinence episodes with plans to start a trial of Gemtesa. Per wife medication never started given cost.  PVRs on 6/7+300 status post 1 straight cath, but PVRs prior to discharge within appropriate range (25-100). Resumed Flomax 0.4 mg daily.  Chronic conditions: Depression: Continued home Buspar and sertraline Chronic Back Pain: CT spine w/ T11 compression fracture. Per NSGY no acute interventions. Family interested in referral to neurosurgery outpatient. Not on gabapentin per wife HTN: Previously prescribed antihypertensives but has since been discontinued per wife given normotension at home GERD: Continue home PPI HLD: Continue home statin  Touchbase with Outpatient Provider: Warm Handoff: Completed on 07/29/23 by Carlos Hocking, MD  (Intern) via [facility] ______________________________________________________________________ Discharge Medications:    Your Medication List     STOP taking these medications    ibuprofen 200 MG tablet Commonly known as: ADVIL,MOTRIN       START taking these medications    acetaminophen 500 MG tablet Commonly known as: TYLENOL Take 2 tablets (1,000 mg total) by mouth every eight (8) hours as needed for pain.   atorvastatin 40 MG tablet Commonly known as: LIPITOR Take 1 tablet (40 mg total) by mouth daily.   bisacodyl 10 mg suppository Commonly known as: DULCOLAX Insert 1 suppository (10 mg total) into the rectum daily as needed.   carboxymethylcellulose sodium 1 % Dpge Commonly known as: REFRESH CELLUVISC Administer 2 drops to both eyes two (2) times a day.   lidocaine 4 % patch  Commonly known as: ASPERCREME Place 1 patch on the skin daily.   polyethylene glycol 17 gram packet Commonly known as: MIRALAX Take 17 g by mouth Three (3) times a day as needed.   senna 8.6 mg tablet Commonly known as: SENOKOT Take 2 tablets by mouth two (2) times a day as needed for  constipation.   tamsulosin 0.4 mg capsule Commonly known as: FLOMAX Take 1 capsule (0.4 mg total) by mouth nightly.       CHANGE how you take these medications    insulin glargine-yfgn 100 unit/mL (3 mL) Inpn Inject 48 Units under the skin daily. What changed: how much to take       CONTINUE taking these medications    ALEVE 220 MG tablet Generic drug: naproxen sodium Take 1 tablet (220 mg total) by mouth daily as needed.   aspirin 81 MG tablet Commonly known as: ECOTRIN Take 1 tablet (81 mg total) by mouth daily.   busPIRone 10 MG tablet Commonly known as: BUSPAR Take 2 tablets (20 mg total) by mouth daily.   NovoLOG Flexpen U-100 Insulin 100 unit/mL (3 mL) injection pen Generic drug: insulin aspart Inject 0.06 mL (6 Units total) under the skin Three (3) times a day before meals.   omeprazole 40 MG capsule Commonly known as: PriLOSEC Take 1 capsule (40 mg total) by mouth daily.   sertraline 100 MG tablet Commonly known as: ZOLOFT Take 2 tablets (200 mg total) by mouth daily.        Allergies: Dilaudid [hydromorphone (bulk)] and Metformin ______________________________________________________________________ Pending Test Results:   Most Recent Labs: All lab results last 24 hours -  Recent Results (from the past 24 hours)  POCT Glucose   Collection Time: 07/28/23  5:27 PM  Result Value Ref Range   Glucose, POC 164 70 - 179 mg/dL  POCT Glucose   Collection Time: 07/28/23  8:43 PM  Result Value Ref Range   Glucose, POC 223 (H) 70 - 179 mg/dL  CBC   Collection Time: 07/29/23  6:09 AM  Result Value Ref Range   WBC 7.9 3.6 - 11.2 10*9/L   RBC 4.19 (L) 4.26 - 5.60 10*12/L   HGB 13.4 12.9 - 16.5 g/dL   HCT 62.5 (L) 60.9 - 51.9 %   MCV 89.3 77.6 - 95.7 fL   MCH 31.9 25.9 - 32.4 pg   MCHC 35.7 32.0 - 36.0 g/dL   RDW 86.3 87.7 - 84.7 %   MPV 7.7 6.8 - 10.7 fL   Platelet 270 150 - 450 10*9/L  Basic Metabolic Panel   Collection Time: 07/29/23  6:09 AM   Result Value Ref Range   Sodium 140 135 - 145 mmol/L   Potassium 3.7 3.4 - 4.8 mmol/L   Chloride 106 98 - 107 mmol/L   CO2 26.0 20.0 - 31.0 mmol/L   Anion Gap 8 5 - 14 mmol/L   BUN 14 9 - 23 mg/dL   Creatinine 9.08 9.26 - 1.18 mg/dL   BUN/Creatinine Ratio 15    eGFR CKD-EPI (2021) Male 89 >=60 mL/min/1.60m2   Glucose 116 70 - 179 mg/dL   Calcium 9.2 8.7 - 89.5 mg/dL  POCT Glucose   Collection Time: 07/29/23  7:10 AM  Result Value Ref Range   Glucose, POC 125 70 - 179 mg/dL  POCT Glucose   Collection Time: 07/29/23 11:46 AM  Result Value Ref Range   Glucose, POC 127 70 - 179 mg/dL    Relevant Studies/Radiology: ECG 12  Lead Result Date: 07/20/2023 NORMAL SINUS RHYTHM INFERIOR INFARCT (CITED ON OR BEFORE 18-Jul-2023) ABNORMAL ECG WHEN COMPARED WITH ECG OF 18-Jul-2023 16:31, NO SIGNIFICANT CHANGE WAS FOUND Confirmed by Leni Mings ([id number]) on 07/20/2023 10:51:33 PM  XR Abdomen 1 View Result Date: 07/19/2023 EXAM: XR ABDOMEN 1 VIEW ACCESSION: [id number] UN REPORT DATE: 07/19/2023 7:57 AM CLINICAL INDICATION: 73 years old with ABDOMINAL PAIN  -  UNSPECIFIED SITE  COMPARISON: None TECHNIQUE: Supine view of the abdomen, 3 image(s) FINDINGS: Posterior spinal hardware overlying the lower lumbar spine. Small calcification identified within the medial right hemiabdomen, indeterminant, possibly in ureter. Calcified phleboliths overlie the pelvis. Moderate colonic stool burden throughout the entire colon without evidence of bowel obstruction. Lung bases clear. Degenerative change throughout the spine with T12 compression deformity.   Nonobstructive bowel gas pattern. Single indeterminant calcification overlying the medial right hemiabdomen, possibly in the ureter. Correlate with symptomatology.   MRI brain without contrast Result Date: 07/19/2023 EXAM: Magnetic resonance imaging, brain, without contrast material. ACCESSION: [id number] UN CLINICAL INDICATION: 73 years old Male with AMS, right upper and  lower extremity weakness, negative CT findings, concern for stroke  COMPARISON: CTA head 07/18/2023 TECHNIQUE: Multiplanar, multisequence MR imaging of the brain was performed without I.V. contrast. FINDINGS:  Right cerebral hemisphere susceptibility artifact correlates to a nonspecific calcification is seen on CT. There are scattered and confluent foci of signal abnormality within the periventricular and deep white matter.  These are nonspecific but commonly seen with small vessel ischemic changes. Moderate parenchymal volume loss with compensatory dilation of the ventricles. 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 There is no midline shift. No extra-axial fluid collection. No evidence of acute intracranial hemorrhage. No diffusion weighted signal abnormality to suggest acute infarct. No mass.   No acute intracranial abnormality. Chronic small vessel ischemic changes. 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 ==================== MODIFIED REPORT: (07/19/2023 7:02 AM) This report has been modified from its preliminary version; you may check the prior versions of radiology report, results history link for prior report versions (if they were previously visible in Epic). -----------------------------------------------   CTA Chest/Abd (Aortic Dissection) Result Date: 07/18/2023 EXAM: CTA Chest, Abdomen, Pelvis for Aortic Dissection DATE: 07/18/2023 7:09 PM ACCESSION: [id number] UN DICTATED: 07/18/2023 7:18 PM INTERPRETATION LOCATION: MAIN CAMPUS CLINICAL INDICATION: 73 years old Male with chest pain, neurologic deficits  COMPARISON: Chest CT 07/02/2023 TECHNIQUE: A helical CT of the chest was obtained without IV contrast from the thoracic inlet through the hemidiaphragms. Images were reconstructed in the axial plane. Next, a spiral CTA  of the chest, abdomen and pelvis was obtained with  IV contrast from the thoracic inlet through the aortic bifurcation. Images were reconstructed in the axial plane.  Multiplanar reformatted and MIP images are provided for further evaluation of the aorta. FINDINGS: AORTA: Normal caliber aorta. No thoracic aortic intramural hematoma.  No aortic dissection. CHEST: Normal heart size. Coronary artery calcifications. Aortic and mitral annular calcifications. No pericardial effusion. No mediastinal lymphadenopathy. Clear central airways. Bilateral dependent atelectasis. Trace bilateral pleural effusions. No pneumothorax. ABDOMEN and PELVIS: HEPATOBILIARY: No focal hepatic lesions. The gallbladder is normal in appearance. No biliary dilatation.  SPLEEN: Unremarkable. PANCREAS: Fatty atrophy. ADRENALS: Unremarkable. KIDNEYS/URETERS: Unremarkable. BLADDER: Circumferential wall thickening. PELVIC/REPRODUCTIVE ORGANS: Prostatomegaly. GI TRACT: No dilated or thick walled loops of bowel. PERITONEUM/RETROPERITONEUM AND MESENTERY: No free air or fluid. LYMPH NODES: No enlarged lymph nodes. BONES: Remote fracture of the right inferior pubic ramus.  Posterior fixation hardware of L3-L5 is grossly intact. Compression deformities of the T3, T5, T7, and T12 vertebral bodies. The T12 compression deformity is most severe with approximately 90% height loss. There is limited sclerosis indicating at least some degree of an acute component. The other compression deformities are mild with the T3 being most severe with approximately 30% height loss. These all appear chronic in nature. Severe multilevel degenerative changes of the spine with vacuum phenomena at multiple levels. SOFT TISSUES: Unremarkable.   --No evidence of aortic dissection, as clinically questioned. --Severe, acute versus acute on chronic T12 compression fracture with approximately 90% height loss.   CTA Head W Contrast Result Date: 07/18/2023 EXAM: Computed tomographic angiography, head, with contrast material and image  postprocessing. DATE: 07/18/2023 7:09 PM ACCESSION: [id number] UN DICTATED: 07/18/2023 7:16 PM INTERPRETATION LOCATION: [facility] CLINICAL INDICATION: 73 years old Male with AMS, right sided weakness  COMPARISON: Same day head CT. TECHNIQUE: CT angiogram of the head during contrast bolus infusion were acquired.  Reformatted sagittal, coronal MIP images are provided. 3D volume rendered images using a stand alone workstation are also provided. FINDINGS: No intracranial hemorrhage, acute infarct or mass. Nonspecific calcification in the right cerebellar hemisphere. Calcific atherosclerotic disease of the bilateral cavernous carotid arteries and V4 segment of the left vertebral artery. The osseous structures are unremarkable. The major arterial structures are normal in appearance. No definite stenosis. No aneurysm visualized. Bilateral lens replacements.   No intracranial large vessel occlusion, high-grade stenosis or aneurysm.  CTA Neck W Contrast Result Date: 07/18/2023 EXAM: Computed tomographic angiography, neck, with contrast material, including noncontrast images, if performed, and image postprocessing on a stand alone workstation. DATE: 07/18/2023 7:09 PM ACCESSION: [id number] UN DICTATED: 07/18/2023 7:12 PM INTERPRETATION LOCATION: [facility] CLINICAL INDICATION: 73 years old Male with AMS, right sided weakness  COMPARISON: None TECHNIQUE: Contiguous axial CT angiogram of the neck during contrast bolus infusion were acquired.  Multiplanar reformatted and MIP images were provided. For selected cases, 3D volume rendered images are also provided. FINDINGS: No evidence of dissection or aneurysm in the carotid or vertebral arteries. Mild calcific atherosclerotic disease of the bilateral carotid bulbs without significant hemodynamic stenosis. The soft tissues are unremarkable without lymphadenopathy, mass or abscess. Degenerative changes of the cervical spine. Reversal of normal cervical lordosis. The  airway is patent and midline. The lung apices are unremarkable. In this report, if stenosis of the internal carotid artery is reported, it is calculated based on the NASCET method: (1 - (minimal residual diameter/normal diameter of distal ICA)).   No acute vascular abnormality within the neck. No high-grade stenosis, occlusion, or aneurysm.   XR Chest Portable Result Date: 07/18/2023 EXAM: XR CHEST PORTABLE DATE: 07/18/2023 6:22 PM ACCESSION: [id number] UN DICTATED: 07/18/2023 6:24 PM INTERPRETATION LOCATION: MAIN CAMPUS CLINICAL INDICATION: 73 years old Male with CHEST PAIN  TECHNIQUE: Single frontal view of the chest. COMPARISON: None FINDINGS: Lung: Bibasilar atelectasis. Pleura: No pleural effusion or pneumothorax identified. Mediastinum: Mediastinal widening that is new from 01/29/2023. Bones: No evidence of acute osseous abnormality is identified.   New mediastinal widening since prior radiograph. While this may be projectional / technical, given clinical history of chest pain and syncopal fall, an underlying aortic process cannot be excluded. Consider further evaluation with CT of the chest.   XR Ankle 3 or More Views Right Result Date: 07/18/2023 EXAM: XR ANKLE 3 OR MORE VIEWS RIGHT DATE: 07/18/2023 6:22 PM ACCESSION: [id number] UN DICTATED: 07/18/2023 6:25 PM INTERPRETATION LOCATION: Main Campus CLINICAL INDICATION: 73 years  old Male with overlying wound and erythema over lateral malleolus  COMPARISON: None. TECHNIQUE: AP, oblique and lateral views of the right ankle. FINDINGS: No displaced fracture. Ankle mortise is congruent. Tibiofibular syndesmosis is intact. Small calcaneal enthesophyte. Vascular calcifications.   No acute osseous abnormality.   XR Trauma Hip Right Result Date: 07/18/2023 EXAM: XR TRAUMA HIP RIGHT DATE: 07/18/2023 6:22 PM ACCESSION: [id number] UN DICTATED: 07/18/2023 6:23 PM INTERPRETATION LOCATION: Main [facility] CLINICAL INDICATION: 73 years old Male with fall, right hip  pain    COMPARISON: Radiograph 01/29/2023 TECHNIQUE: AP view of the pelvis. AP and cross table lateral views of the right femur. FINDINGS: Osteopenia, limiting evaluation for nondisplaced fractures. No displaced fracture. No gross malalignment. Osteoarthrosis bilateral hips and visualized right knee joint. Nonobstructive bowel gas pattern. Vascular calcifications.   No evidence of displaced fracture or gross malalignment. Please note that diffuse osseous demineralization limits evaluation for nondisplaced fractures. If there is continued concern for a nondisplaced fracture, better evaluation could be obtained with dedicated MRI.  CT head WO contrast Result Date: 07/18/2023 EXAM: CT HEAD WO CONTRAST, CT CERVICAL SPINE WO CONTRAST, CT MAXILLOFACIAL WO CONTRAST DATE: 07/18/2023 5:25 PM ACCESSION: [id number] ROLANDA [id number] ROLANDA [id number] UN DICTATED: 07/18/2023 5:34 PM INTERPRETATION LOCATION: MAIN CAMPUS CLINICAL INDICATION: 73 years old Male with AMS  COMPARISON: Head CT 01/29/2023. Cervical spine CT 01/29/2023. TECHNIQUE: Volumetric CT acquisition was performed through the head, maxillofacial and cervical spine without the use of intravenous contrast. Post processed coronal and sagittal reformatted images were obtained. FINDINGS: CT HEAD: The gray-white matter differentiation is intact. There is no evidence of acute infarct, hemorrhage, mass or mass effect. Global cerebral volume loss with ex vacuo dilatation of the ventricles. There are periventricular low-attenuation white matter changes, likely small vessel ischemic disease. The mastoid air cells are aerated. No acute calvarial fracture is identified. Bilateral basal ganglia mineralization. Bilateral lens replacements. CT MAXILLOFACIAL: No acute maxillofacial fracture is identified. The temporomandibular joints are appropriately aligned. The visualized paranasal sinuses are clear. The globes appear intact. There is no evidence of retrobulbar hemorrhage.  Chronic appearing deformity of the right lateral pterygoid plate. CT CERVICAL SPINE: There is straightening of the usual cervical lordosis. There is normal mineralization and alignment. No acute fracture is identified.multilevel degenerative changes of the cervical spine. The facet joints are normally aligned with multilevel facet arthropathy. The prevertebral soft tissues appear unremarkable.   No evidence of acute intracranial pathology. No acute maxillofacial fracture identified. Multilevel degenerative changes of the cervical spine without evidence of acute fracture or dislocation.   CT Maxillofacial Wo Contrast Result Date: 07/18/2023 EXAM: CT HEAD WO CONTRAST, CT CERVICAL SPINE WO CONTRAST, CT MAXILLOFACIAL WO CONTRAST DATE: 07/18/2023 5:25 PM ACCESSION: [id number] ROLANDA [id number] ROLANDA [id number] UN DICTATED: 07/18/2023 5:34 PM INTERPRETATION LOCATION: MAIN CAMPUS CLINICAL INDICATION: 73 years old Male with AMS  COMPARISON: Head CT 01/29/2023. Cervical spine CT 01/29/2023. TECHNIQUE: Volumetric CT acquisition was performed through the head, maxillofacial and cervical spine without the use of intravenous contrast. Post processed coronal and sagittal reformatted images were obtained. FINDINGS: CT HEAD: The gray-white matter differentiation is intact. There is no evidence of acute infarct, hemorrhage, mass or mass effect. Global cerebral volume loss with ex vacuo dilatation of the ventricles. There are periventricular low-attenuation white matter changes, likely small vessel ischemic disease. The mastoid air cells are aerated. No acute calvarial fracture is identified. Bilateral basal ganglia mineralization. Bilateral lens replacements. CT MAXILLOFACIAL: No acute maxillofacial fracture is identified. The temporomandibular joints are appropriately aligned. The visualized paranasal sinuses are clear. The globes  appear intact. There is no evidence of retrobulbar hemorrhage. Chronic appearing deformity of the  right lateral pterygoid plate. CT CERVICAL SPINE: There is straightening of the usual cervical lordosis. There is normal mineralization and alignment. No acute fracture is identified.multilevel degenerative changes of the cervical spine. The facet joints are normally aligned with multilevel facet arthropathy. The prevertebral soft tissues appear unremarkable.   No evidence of acute intracranial pathology. No acute maxillofacial fracture identified. Multilevel degenerative changes of the cervical spine without evidence of acute fracture or dislocation.   CT Cervical Spine Wo Contrast Result Date: 07/18/2023 EXAM: CT HEAD WO CONTRAST, CT CERVICAL SPINE WO CONTRAST, CT MAXILLOFACIAL WO CONTRAST DATE: 07/18/2023 5:25 PM ACCESSION: [id number] ROLANDA [id number] ROLANDA [id number] UN DICTATED: 07/18/2023 5:34 PM INTERPRETATION LOCATION: MAIN CAMPUS CLINICAL INDICATION: 73 years old Male with AMS  COMPARISON: Head CT 01/29/2023. Cervical spine CT 01/29/2023. TECHNIQUE: Volumetric CT acquisition was performed through the head, maxillofacial and cervical spine without the use of intravenous contrast. Post processed coronal and sagittal reformatted images were obtained. FINDINGS: CT HEAD: The gray-white matter differentiation is intact. There is no evidence of acute infarct, hemorrhage, mass or mass effect. Global cerebral volume loss with ex vacuo dilatation of the ventricles. There are periventricular low-attenuation white matter changes, likely small vessel ischemic disease. The mastoid air cells are aerated. No acute calvarial fracture is identified. Bilateral basal ganglia mineralization. Bilateral lens replacements. CT MAXILLOFACIAL: No acute maxillofacial fracture is identified. The temporomandibular joints are appropriately aligned. The visualized paranasal sinuses are clear. The globes appear intact. There is no evidence of retrobulbar hemorrhage. Chronic appearing deformity of the right lateral pterygoid plate. CT  CERVICAL SPINE: There is straightening of the usual cervical lordosis. There is normal mineralization and alignment. No acute fracture is identified.multilevel degenerative changes of the cervical spine. The facet joints are normally aligned with multilevel facet arthropathy. The prevertebral soft tissues appear unremarkable.   No evidence of acute intracranial pathology. No acute maxillofacial fracture identified. Multilevel degenerative changes of the cervical spine without evidence of acute fracture or dislocation.   ECG 12 Lead Result Date: 07/18/2023 NORMAL SINUS RHYTHM NONSPECIFIC ST AND T WAVE ABNORMALITY WHEN COMPARED WITH ECG OF 29-Jan-2023 14:57, T WAVE INVERSION NOW EVIDENT IN INFERIOR LEADS Confirmed by Mazzella, Tony ([id number]) on 07/18/2023 5:04:02 PM  ______________________________________________________________________ Discharge Instructions:  Activity Instructions     Activity as tolerated        Follow Up instructions and Outpatient Referrals    Ambulatory Referral to Home Health     Reason for referral: Strengthening and conditioning   Physician to follow patient's care: PCP   Disciplines requested:  Physical Therapy Occupational Therapy     Physical Therapy requested:  Evaluate and treat Home safety evaluation     Occupational Therapy Requested:  Home safety evaluation Evaluate and treat     Ambulatory Referral to Neurosurgery     Reason for referral: pt w/severe acute on chronic T12 compression  fracture w/90% height loss   Requested follow up plan: You would evaluate and manage.   Call MD for:  difficulty breathing, headache or visual disturbances     Call MD for:  persistent nausea or vomiting     Call MD for:  severe uncontrolled pain     Call MD for:  temperature >38.5 Celsius       Appointments which have been scheduled for you    Sep 11, 2023 12:45 PM (Arrive by 12:20 PM) NEW COGNITIVE TEST with NEUGEN TESTING [facility] [address] [place] [facility]) [address] [place] [address] [phone]     Sep 11, 2023 1:45 PM (Arrive by 1:20 PM) NEW COGNITIVE MD with Chiquita VEAR Asa, MD [facility] [address] [place] [facility]) [address] [place] [address] [phone]        ______________________________________________________________________ Discharge Day Services: BP 131/82   Pulse 74   Temp 36.2 C (97.1 F) (Oral)   Resp 19   Ht 182.9 cm (6')   Wt (!) 104.8 kg (231 lb 0.7 oz)   SpO2 100%   BMI 31.33 kg/m   Pt seen on the day of discharge and determined appropriate for discharge.  Condition at Discharge: good  Length of Discharge: I spent greater than 30 mins in the discharge of this patient.

## 2023-08-26 NOTE — Progress Notes (Signed)
 Neurosurgery Clinic Note  Patient Name  Carlos Salazar [address] [place] [address] _____________________________________________________  History of Present Illness 08/26/2023 - Patient is a 73 y.o. male who is seen in consultation at the request of Phillips, Lindsey Elexa* for an evaluation of T12 compression fracture, acute on chronic. Patient describes pain as achy, stabbing in his low back near SI joints bilaterally. Patient has had prior lumbar fusion with no hardware concerns with recent imaging, and he is currently doing home health PT. Patient is seeing neurology in a few weeks to discuss weak legs since he stroke.  Past Medical History[1] Past Surgical History[2] Problem List[3]  Past medical and surgical history plus medication and allergies reviewed in [email or web address]   Social History accompanied by daughter and wife  Physical Exam Vital Signs: Stable today, reviewed in [email or web address]    General Appearance:  NAD. Sitting comfortably in exam room.  The patient was awake, alert and oriented 3.  Naming, repetition and fund of knowledge were intact.  The patient was a good historian for recent and remote events surrounding their illness. Followed commands easily.  Cranial nerves: PERRLA. Extraocular movements were full, no nystagmus. Peripheral fields intact to confrontation. Facial sensation was intact, facial movement was symmetric, phonation was normal, tongue was midline, shoulder shrug symmetric.  CV: Pink and well-perfused, brisk capillary fill no edema, except as noted in HPI and exam RESP: Breathing is even and nonlabored, acyanotic, except as noted in HPI and exam GI: Abdomen is soft nontender, nondistended, except as noted in HPI and exam SKIN: Intact with no evidence of rashes or lesions, except as noted in HPI and exam  Hoffman's: Negative Ankle Clonus: Negative  Gait: (8) Unable to walk, even supported  Eyes closed, finger to nose: normal Rapid arm  movement: normal Piano movement with fingers: normal  Heel stepping: Deferred Toe stepping: Deferred  Deep tendon reflexes: 2+ and symmetric  Upper Extremity Motor Right Left  Scapular stabilization (C4) 5/5 5/5  Deltoid:  (C5) 5/5 5/5  Biceps (C5-6) 5/5 5/5  Brachioradialis (C6) 5/5 5/5  Triceps (C7): 5/5 5/5  Hand grip (C8) 5/5 5/5  Finger abduction (T1) 5/5 5/5   Lower Extremity Motor Right Left  Hip flexion (L2) 5/5 5/5  Knee extension:  (L3) 5/5 5/5  Ankle Dorsiflexion (L4) 5/5 5/5  EHL (L5) 5/5 5/5  Plantar flexion (S1) 5/5 5/5  FHL (S2) 5/5 5/5   Upper Extremity Sensory Right Left  Upper Shoulder (C4) 2/2 2/2  Deltoid, lateral arm:  (C5) 2/2 2/2  Thumb, lateral forearm (C6) 2/2 2/2  Fingers 2,3,4 (C7) 2/2 2/2  Finger 5 (C8) 2/2 2/2  Medial Elbow (T1) 2/2 2/2   Lower Extremity Sensory Right Left  Iliac Crest (L1) 2/2 2/2  Inner thigh:  (L2) 2/2 2/2  Medial knee  (L3) 2/2 2/2  Medial leg (L4) 2/2 2/2  Lateral leg, top of foot (L5) 2/2 2/2  Posterior leg (S1) 2/2 2/2  Plantar foot (S2) 2/2 2/2   Test Results Imaging reviewed personally unless otherwise documented:  CTA Chest/Abd - 07/18/23 Status: Final result  - Severe, acute versus acute on chronic T12 compression fracture with approximately 90% height loss  Lab Results  Component Value Date   A1C 6.5 (H) 07/19/2023   ____________________________________________________ Assessment  Diagnosis ICD-10-CM Associated Orders  1. Compression fracture of T12 vertebra, initial encounter     S22.080A XR Thoracic Spine 2 Views      Plan After discussions  with the patient regarding treatment options, patient would prefer conservative options for treatment instead of surgical options at this point. We discussed conservative measures such as the over the counter medications below, oral corticosteroid tapers, as well as physical therapy focused on core strengthening and gait training. Other option in consideration  is seeing pain medicine to discuss if pain treatment procedures or medication options would be helpful.  08/26/2023 - We will set them up with x-rays today for fracture tracking, and we will follow-up with them in 4 weeks for repeat assessment.  AUGUSTIN DELENA KURK, PA Department of Neurosurgery, Spine & Peripheral Nerve Phone: [phone] Fax: [phone]  Disclaimers and additional information Patient advised that if they have any questions at any time, the best possible contact method for our team and our staff is via MyChart messaging, with the next option being the phone number below that goes to our clinical staff line. Patient also instructed that if their pain significantly increases prior to workup completion, to first send a MyChart message or call the clinic if during business hours or return to their local or [facility] or their local emergency department for assessment.       [1] Past Medical History: Diagnosis Date  . Chronic pain   . Diabetes mellitus      [2] Past Surgical History: Procedure Laterality Date  . CATARACT EXTRACTION    . KNEE SURGERY    . SPINE SURGERY  2010   Two surgeries  [3] Patient Active Problem List Diagnosis  . Chronic back pain  . Unspecified depressive disorder  . Medication withdrawal     . Benign prostatic hyperplasia  . CAD (coronary artery disease)  . Essential hypertension  . Gastroesophageal reflux disease without esophagitis  . History of fall  . History of medication noncompliance  . Hx of subarachnoid hemorrhage  . Meniere's disease  . Mild cognitive impairment  . Mixed hyperlipidemia  . Sleep apnea  . Type 2 diabetes mellitus without complications     . S/P cataract extraction and insertion of intraocular lens, left  . Osteoarthritis of both knees  . Hx of transient ischemic attack (TIA)

## 2023-09-06 NOTE — Progress Notes (Unsigned)
 09/08/2023 8:32 PM   Carlos FORBES Simon Sr. 05/02/1950 [id number]  Referring provider: Center, [facility] [address] [place],  [place] [address]  Urological history: 1. BPH with LU TS - Gemtesa 75 mg daily, cost-prohibitive - tamsulosin 0.8 mg daily   2. Elevated PSA - PSA (06/2023) 8.2  No chief complaint on file.  HPI: Carlos SCHLAFER Sr. is a 73 y.o. man who presents today for elevated PSA.   Previous records reviewed.   He was last seen by Dr. Twylla for a PSA of 5.5 in May of this year.   A repeat PSA was 8.2.      PMH: Past Medical History:  Diagnosis Date   Diabetes mellitus without complication (HCC)    Meniere disease     Surgical History: Past Surgical History:  Procedure Laterality Date   BACK SURGERY     HERNIA REPAIR     KNEE SURGERY      Home Medications:  Allergies as of 09/08/2023       Reactions   Hydromorphone Nausea Only, Nausea And Vomiting   Metformin Diarrhea        Medication List        Accurate as of September 06, 2023  8:32 PM. If you have any questions, ask your nurse or doctor.          albuterol  108 (90 Base) MCG/ACT inhaler Commonly known as: VENTOLIN  HFA Inhale 2 puffs into the lungs every 6 (six) hours as needed for wheezing.   famotidine  20 MG tablet Commonly known as: PEPCID  Take 1 tablet (20 mg total) by mouth 2 (two) times daily.   insulin aspart 100 UNIT/ML injection Commonly known as: novoLOG Inject into the skin 3 (three) times daily before meals.   insulin glargine 100 UNIT/ML injection Commonly known as: LANTUS Inject into the skin daily.   omeprazole 40 MG capsule Commonly known as: PRILOSEC Take 40 mg by mouth daily.   silodosin  8 MG Caps capsule Commonly known as: RAPAFLO  TAKE 1 CAPSULE BY MOUTH DAILY WITH BREAKFAST.        Allergies:  Allergies  Allergen Reactions   Hydromorphone Nausea Only and Nausea And Vomiting   Metformin Diarrhea    Family History: Family History   Problem Relation Age of Onset   Alzheimer's disease Father    Stroke Father     Social History: See HPI for pertinent social history  ROS: Pertinent ROS in HPI  Physical Exam: There were no vitals taken for this visit.  Constitutional:  Well nourished. Alert and oriented, No acute distress. HEENT: [place] AT, moist mucus membranes.  Trachea midline, no masses. Cardiovascular: No clubbing, cyanosis, or edema. Respiratory: Normal respiratory effort, no increased work of breathing. GI: Abdomen is soft, non tender, non distended, no abdominal masses. Liver and spleen not palpable.  No hernias appreciated.  Stool sample for occult testing is not indicated.   GU: No CVA tenderness.  No bladder fullness or masses.  Patient with circumcised/uncircumcised phallus. ***Foreskin easily retracted***  Urethral meatus is patent.  No penile discharge. No penile lesions or rashes. Scrotum without lesions, cysts, rashes and/or edema.  Testicles are located scrotally bilaterally. No masses are appreciated in the testicles. Left and right epididymis are normal. Rectal: Patient with  normal sphincter tone. Anus and perineum without scarring or rashes. No rectal masses are appreciated. Prostate is approximately *** grams, *** nodules are appreciated. Seminal vesicles are normal. Skin: No rashes, bruises or suspicious lesions.  Lymph: No cervical or inguinal adenopathy. Neurologic: Grossly intact, no focal deficits, moving all 4 extremities. Psychiatric: Normal mood and affect.  Laboratory Data: See EPIC and HPI  I have reviewed the labs.   Pertinent Imaging: @CT @ @ultrasound @ @KUB @ I have independently reviewed the films.    Assessment & Plan:  ***  1. Elevated PSA - we discussed the risk of prostate cancer being present, though competing risks from co morbidities may outweigh benefit of aggressive workup or treatment - discussed risks and benefits of further workup, including prostate MRI and/or  biopsy - discussed that in a randomized trial, no reduction in mortality among men > 37 years of age  - shared decision-making held: given patient's age, comorbidities, and current asymptomatic status, the patient prefers conservative management and surveillance *** - will monitor PSA periodically, unless symptoms develop *** - advised to report new symptoms such as bone pain, weight loss, or urinary obstruction *** - if clinical status changes or PSA rises significantly, revisit discussion regarding further work up ***   No follow-ups on file.  These notes generated with voice recognition software. I apologize for typographical errors.  CLOTILDA HELON RIGGERS  [facility] [address]  [address] [place], [place] [address] [phone]

## 2023-09-08 ENCOUNTER — Encounter: Payer: Self-pay | Admitting: Urology

## 2023-09-08 ENCOUNTER — Ambulatory Visit (INDEPENDENT_AMBULATORY_CARE_PROVIDER_SITE_OTHER): Payer: Medicare (Managed Care) | Admitting: Urology

## 2023-09-08 VITALS — BP 147/93 | HR 105 | Ht 72.0 in | Wt 220.0 lb

## 2023-09-08 DIAGNOSIS — R972 Elevated prostate specific antigen [PSA]: Secondary | ICD-10-CM

## 2023-09-09 ENCOUNTER — Ambulatory Visit: Payer: Self-pay | Admitting: Urology

## 2023-09-09 LAB — PSA, TOTAL AND FREE
PSA, Free Pct: 8.7 %
PSA, Free: 0.46 ng/mL
Prostate Specific Ag, Serum: 5.3 ng/mL — ABNORMAL HIGH (ref 0.0–4.0)

## 2023-09-18 ENCOUNTER — Telehealth: Payer: Self-pay

## 2023-09-18 NOTE — Telephone Encounter (Signed)
 Pts daughter Clayborne left message on triage line stating that pt has a UTI and they would like a ATB.   Advised that we need a ua/ucx before rx atb. Offered appt for next week. She states he can't wait that long. Advised urgent care or ED.   Clayborne states why did we not do a u/a and culture at [facility] on 7/22. I informed her that I do not have an answer as I was not here and the provider is not in  for me to ask.   Pt will figure something out and give us  a call back.

## 2023-09-18 NOTE — Telephone Encounter (Signed)
 Pt's daughter LVM on the triage line ( not on the [facility]) staging she feels like the pt has a UTI.  Placed call to Pt's wife (on [place]). Wife states that she knows the pt has a UTI, symptoms are he is sleeping a lot, no appetite, headache, cloudy urine and confusion . Pt's wife states pt denies fever and chills. States he has dementia.  Per Sam, okay to offer Monday with [facility]. Pt's wife declined visit because that's too far out, states they will call back if they can't be seen sooner with PCP or urgent care.

## 2023-09-20 NOTE — Progress Notes (Unsigned)
 09/21/2023 4:04 PM   Carlos FORBES Simon Sr. 1950-10-20 [id number]  Referring provider: Center, [facility] [address] [place],  [place] [address]  Urological history: 1. BPH with LU TS - Gemtesa 75 mg daily, cost-prohibitive - tamsulosin 0.8 mg daily   2. Elevated PSA - PSA (06/2023) 8.2  Chief Complaint  Patient presents with   Urinary Tract Infection   HPI: Carlos PRIMM Sr. is a 73 y.o. man who presents today for possible UTI with his wife, Juanita, and daughter, Clayborne.    Previous records reviewed.   His wife contacted the office stating that her husband was sleeping a lot, having no appetite, headache, confusion with cloudy urine.   He has also been having some lower back pain, but that may be secondary to him sitting or laying down.  He cannot ambulate.  Patient denies any modifying or aggravating factors.  Patient denies any recent UTI's, gross hematuria, dysuria or suprapubic/flank pain.  Patient denies any fevers, chills, nausea or vomiting.    UA with greater than 500 glucose, 21-50 WBCs, 11-20 RBCs and many bacteria.  Serum creatinine (08/2023) 0.88, eGFR >90   His daughter and wife are very concerned about his recurrent UTIs as he was admitted for sepsis and they do not want that to recur.  He has been on 5 days of Cipro approximately 3 times over the last 2 months.  He had a CT in May to evaluate for aneurysm and it was negative, so his back pain is not due to this.  Kidneys are unremarkable.  PMH: Past Medical History:  Diagnosis Date   Diabetes mellitus without complication (HCC)    Meniere disease     Surgical History: Past Surgical History:  Procedure Laterality Date   BACK SURGERY     HERNIA REPAIR     KNEE SURGERY      Home Medications:  Allergies as of 09/21/2023       Reactions   Hydromorphone Nausea Only, Nausea And Vomiting   Metformin Diarrhea        Medication List        Accurate as of September 21, 2023  4:04 PM. If you have  any questions, ask your nurse or doctor.          STOP taking these medications    albuterol  108 (90 Base) MCG/ACT inhaler Commonly known as: VENTOLIN  HFA Stopped by: Ziva Nunziata   famotidine  20 MG tablet Commonly known as: PEPCID  Stopped by: Lashae Wollenberg       TAKE these medications    amoxicillin -clavulanate 875-125 MG tablet Commonly known as: AUGMENTIN  Take 1 tablet by mouth every 12 (twelve) hours. Started by: CLOTILDA CORNWALL   aspirin 81 MG chewable tablet Chew by mouth daily.   atorvastatin 40 MG tablet Commonly known as: LIPITOR Take 40 mg by mouth daily.   AZO CRANBERRY PO Take by mouth daily.   busPIRone 10 MG tablet Commonly known as: BUSPAR Take 10 mg by mouth 3 (three) times daily.   cetirizine 10 MG tablet Commonly known as: ZYRTEC Take 10 mg by mouth daily.   Dexcom G7 Sensor Misc   insulin aspart 100 UNIT/ML injection Commonly known as: novoLOG Inject into the skin 3 (three) times daily before meals.   insulin glargine 100 UNIT/ML injection Commonly known as: LANTUS Inject into the skin daily.   methocarbamol 500 MG tablet Commonly known as: ROBAXIN Take 500 mg by mouth 4 (four) times daily.  omeprazole 40 MG capsule Commonly known as: PRILOSEC Take 40 mg by mouth daily.   sertraline 100 MG tablet Commonly known as: ZOLOFT Take 100 mg by mouth daily.   tamsulosin 0.4 MG Caps capsule Commonly known as: FLOMAX Take 0.4 mg by mouth.        Allergies:  Allergies  Allergen Reactions   Hydromorphone Nausea Only and Nausea And Vomiting   Metformin Diarrhea    Family History: Family History  Problem Relation Age of Onset   Alzheimer's disease Father    Stroke Father     Social History: See HPI for pertinent social history  ROS: Pertinent ROS in HPI  Physical Exam: BP (!) 152/90 (BP Location: Right Arm, Patient Position: Sitting, Cuff Size: Normal)   Pulse (!) 102   Ht 6' (1.829 m)   Wt 220 lb (99.8 kg)    SpO2 96%   BMI 29.84 kg/m   Constitutional:  Well nourished. Alert and oriented, No acute distress. HEENT: [place] AT, moist mucus membranes.  Trachea midline Cardiovascular: No clubbing, cyanosis, or edema. Respiratory: Normal respiratory effort, no increased work of breathing. Neurologic: Grossly intact, no focal deficits, moving all 4 extremities. Psychiatric: Normal mood and affect.    Laboratory Data: See EPIC and HPI  I have reviewed the labs.   Pertinent Imaging: N/A  Assessment & Plan:    1. Cloudy urine - UA suspicious for infection - urine culture -Started Augmentin  875/125 twice daily for 7 days, will adjust if necessary once urine culture results are available - discussed seeing his other specialist or seeking care in the ED  if his symptoms do not improve after 72 hours on the antibiotic or worsen while on the antibiotic  - Follow-up will be pending urine culture results, if urine culture is positive, we will ensure he is treated with culture appropriate antibiotics and then start a low-dose prophylactic antibiotic - If urine culture is negative, we will need to schedule cystoscopy for further evaluation of microscopic hematuria and they will be advised to seek other specialist for symptoms  2. Night time incontinence - Gave him samples of condom catheters to try to see if they can keep him out of depends or at least the depends dry during the nighttime hours to help with skin breakdown and recurrent UTIs - They will contact me once they have decided on a size and length of condom catheter they prefer and we will prescribe the condom catheters for them   Return for Follow up pending labs.  These notes generated with voice recognition software. I apologize for typographical errors.  CLOTILDA HELON RIGGERS  [facility] [address]  [address] [place], [place] [address] [phone]

## 2023-09-21 ENCOUNTER — Encounter: Payer: Self-pay | Admitting: Urology

## 2023-09-21 ENCOUNTER — Other Ambulatory Visit: Payer: Self-pay

## 2023-09-21 ENCOUNTER — Other Ambulatory Visit
Admission: RE | Admit: 2023-09-21 | Discharge: 2023-09-21 | Disposition: A | Discharge status: Home / Self Care | Payer: Medicare (Managed Care) | Attending: Urology | Admitting: Urology

## 2023-09-21 ENCOUNTER — Ambulatory Visit (INDEPENDENT_AMBULATORY_CARE_PROVIDER_SITE_OTHER): Payer: Medicare (Managed Care) | Admitting: Urology

## 2023-09-21 VITALS — BP 152/90 | HR 102 | Ht 72.0 in | Wt 220.0 lb

## 2023-09-21 DIAGNOSIS — R3989 Other symptoms and signs involving the genitourinary system: Secondary | ICD-10-CM | POA: Insufficient documentation

## 2023-09-21 DIAGNOSIS — N3941 Urge incontinence: Secondary | ICD-10-CM

## 2023-09-21 DIAGNOSIS — N401 Enlarged prostate with lower urinary tract symptoms: Secondary | ICD-10-CM

## 2023-09-21 DIAGNOSIS — R82998 Other abnormal findings in urine: Secondary | ICD-10-CM

## 2023-09-21 DIAGNOSIS — R32 Unspecified urinary incontinence: Secondary | ICD-10-CM

## 2023-09-21 DIAGNOSIS — R8290 Unspecified abnormal findings in urine: Secondary | ICD-10-CM

## 2023-09-21 LAB — URINALYSIS, COMPLETE (UACMP) WITH MICROSCOPIC
Bilirubin Urine: NEGATIVE
Glucose, UA: >=500 mg/dL — AB
Ketones, ur: NEGATIVE mg/dL
Nitrite: NEGATIVE
Protein, ur: 30 mg/dL — AB
Specific Gravity, Urine: 1.01 (ref 1.005–1.030)
pH: 5 (ref 5.0–8.0)

## 2023-09-21 MED ORDER — AMOXICILLIN-POT CLAVULANATE 875-125 MG PO TABS: 1.0000 | ORAL_TABLET | Freq: Two times a day (BID) | ORAL | 0 refills | Status: DC

## 2023-09-21 NOTE — Addendum Note (Signed)
 Addended by: SHERRE CHANNEL H on: 09/21/2023 03:14 PM   Modules accepted: Orders

## 2023-09-23 ENCOUNTER — Ambulatory Visit: Payer: Self-pay | Admitting: Urology

## 2023-09-23 ENCOUNTER — Other Ambulatory Visit: Payer: Self-pay

## 2023-09-23 LAB — URINE CULTURE: Culture: >=100000 — AB

## 2023-09-23 MED ORDER — TRIMETHOPRIM 100 MG PO TABS: 100.0000 mg | ORAL_TABLET | Freq: Every day | ORAL | 0 refills | Status: AC

## 2023-09-23 MED ORDER — SULFAMETHOXAZOLE-TRIMETHOPRIM 800-160 MG PO TABS: 1.0000 | ORAL_TABLET | Freq: Two times a day (BID) | ORAL | 0 refills | Status: AC
